# Patient Record
Sex: Female | Born: 1995 | Hispanic: Yes | Marital: Single | Smoking: Never smoker
Health system: Southern US, Community
[De-identification: ages and names within clinical notes are randomized; demographics above are authoritative.]

## PROBLEM LIST (undated history)

## (undated) ENCOUNTER — Inpatient Hospital Stay (HOSPITAL_COMMUNITY): Payer: Self-pay

## (undated) DIAGNOSIS — Z789 Other specified health status: Secondary | ICD-10-CM

## (undated) HISTORY — PX: NO PAST SURGERIES: SHX2092

---

## 2019-05-17 ENCOUNTER — Other Ambulatory Visit: Payer: Self-pay

## 2019-05-17 ENCOUNTER — Encounter (HOSPITAL_COMMUNITY): Payer: Self-pay

## 2019-05-17 ENCOUNTER — Inpatient Hospital Stay (HOSPITAL_COMMUNITY)
Admission: EM | Admit: 2019-05-17 | Discharge: 2019-05-18 | Disposition: A | Discharge status: Home / Self Care | Payer: Self-pay | Attending: Obstetrics & Gynecology | Admitting: Obstetrics & Gynecology

## 2019-05-17 ENCOUNTER — Inpatient Hospital Stay (HOSPITAL_COMMUNITY): Payer: Self-pay

## 2019-05-17 DIAGNOSIS — N760 Acute vaginitis: Secondary | ICD-10-CM

## 2019-05-17 DIAGNOSIS — O9989 Other specified diseases and conditions complicating pregnancy, childbirth and the puerperium: Secondary | ICD-10-CM | POA: Insufficient documentation

## 2019-05-17 DIAGNOSIS — M549 Dorsalgia, unspecified: Secondary | ICD-10-CM | POA: Insufficient documentation

## 2019-05-17 DIAGNOSIS — Z3A10 10 weeks gestation of pregnancy: Secondary | ICD-10-CM | POA: Insufficient documentation

## 2019-05-17 DIAGNOSIS — B9689 Other specified bacterial agents as the cause of diseases classified elsewhere: Secondary | ICD-10-CM

## 2019-05-17 DIAGNOSIS — R109 Unspecified abdominal pain: Secondary | ICD-10-CM | POA: Insufficient documentation

## 2019-05-17 DIAGNOSIS — O209 Hemorrhage in early pregnancy, unspecified: Secondary | ICD-10-CM | POA: Insufficient documentation

## 2019-05-17 DIAGNOSIS — O200 Threatened abortion: Secondary | ICD-10-CM

## 2019-05-17 DIAGNOSIS — N939 Abnormal uterine and vaginal bleeding, unspecified: Secondary | ICD-10-CM

## 2019-05-17 LAB — CBC
HCT: 35.9 % — ABNORMAL LOW (ref 36.0–46.0)
Hemoglobin: 12.2 g/dL (ref 12.0–15.0)
MCH: 29.8 pg (ref 26.0–34.0)
MCHC: 34 g/dL (ref 30.0–36.0)
MCV: 87.8 fL (ref 80.0–100.0)
Platelets: 274 10*3/uL (ref 150–400)
RBC: 4.09 MIL/uL (ref 3.87–5.11)
RDW: 13.1 % (ref 11.5–15.5)
WBC: 7.8 10*3/uL (ref 4.0–10.5)
nRBC: 0 % (ref 0.0–0.2)

## 2019-05-17 LAB — WET PREP, GENITAL
Sperm: NONE SEEN
Trich, Wet Prep: NONE SEEN
Yeast Wet Prep HPF POC: NONE SEEN

## 2019-05-17 LAB — URINALYSIS, ROUTINE W REFLEX MICROSCOPIC
Bilirubin Urine: NEGATIVE
Glucose, UA: NEGATIVE mg/dL
Hgb urine dipstick: NEGATIVE
Ketones, ur: NEGATIVE mg/dL
Nitrite: NEGATIVE
Protein, ur: NEGATIVE mg/dL
Specific Gravity, Urine: 1.027 (ref 1.005–1.030)
pH: 6 (ref 5.0–8.0)

## 2019-05-17 LAB — ABO/RH: ABO/RH(D): B POS

## 2019-05-17 LAB — HCG, QUANTITATIVE, PREGNANCY: hCG, Beta Chain, Quant, S: 11521 m[IU]/mL — ABNORMAL HIGH (ref <5)

## 2019-05-17 MED ORDER — METRONIDAZOLE 500 MG PO TABS: 500.0000 mg | ORAL_TABLET | Freq: Two times a day (BID) | ORAL | 0 refills | Status: DC

## 2019-05-17 NOTE — Discharge Instructions (Signed)
Threatened Miscarriage  A threatened miscarriage occurs when a woman has vaginal bleeding during the first 20 weeks of pregnancy but the pregnancy has not ended. If you have vaginal bleeding during this time, your health care provider will do tests to make sure you are still pregnant. If the tests show that you are still pregnant and that the developing baby (fetus) inside your uterus is still growing, your condition is considered a threatened miscarriage. A threatened miscarriage does not mean your pregnancy will end, but it does increase the risk of losing your pregnancy (complete miscarriage). What are the causes? The cause of this condition is usually not known. For women who go on to have a complete miscarriage, the most common cause is an abnormal number of chromosomes in the developing baby. Chromosomes are the structures inside cells that hold all of a person's genetic material. What increases the risk? The following lifestyle factors may increase your risk of a miscarriage in early pregnancy:  Smoking.  Drinking excessive amounts of alcohol or caffeine.  Recreational drug use. The following preexisting health conditions may increase your risk of a miscarriage in early pregnancy:  Polycystic ovary syndrome.  Uterine fibroids.  Infections.  Diabetes mellitus. What are the signs or symptoms? Symptoms of this condition include:  Vaginal bleeding.  Mild abdominal pain or cramps. How is this diagnosed? If you have bleeding with or without abdominal pain before 20 weeks of pregnancy, your health care provider will do tests to check whether you are still pregnant. These will include:  Ultrasound. This test uses sound waves to create images of the inside of your uterus. This allows your health care provider to look at your developing baby and other structures, such as your placenta.  Pelvic exam. This is an internal exam of your vagina and cervix.  Measurement of your baby's heart  rate.  Laboratory tests such as blood tests, urine tests, or swabs for infection You may be diagnosed with a threatened miscarriage if:  Ultrasound testing shows that you are still pregnant.  Your baby's heart rate is strong.  A pelvic exam shows that the opening between your uterus and your vagina (cervix) is closed.  Blood tests confirm that you are still pregnant. How is this treated? No treatments have been shown to prevent a threatened miscarriage from going on to a complete miscarriage. However, the right home care is important. Follow these instructions at home:  Get plenty of rest.  Do not have sex or use tampons if you have vaginal bleeding.  Do not douche.  Do not smoke or use recreational drugs.  Do not drink alcohol.  Avoid caffeine.  Keep all follow-up prenatal visits as told by your health care provider. This is important. Contact a health care provider if:  You have light vaginal bleeding or spotting while pregnant.  You have abdominal pain or cramping.  You have a fever. Get help right away if:  You have heavy vaginal bleeding.  You have blood clots coming from your vagina.  You pass tissue from your vagina.  You leak fluid, or you have a gush of fluid from your vagina.  You have severe low back pain or abdominal cramps.  You have fever, chills, and severe abdominal pain. Summary  A threatened miscarriage occurs when a woman has vaginal bleeding during the first 20 weeks of pregnancy but the pregnancy has not ended.  The cause of a threatened miscarriage is usually not known.  Symptoms of this condition may   include vaginal bleeding and mild abdominal pain or cramps.  No treatments have been shown to prevent a threatened miscarriage from going on to a complete miscarriage.  Keep all follow-up prenatal visits as told by your health care provider. This is important. This information is not intended to replace advice given to you by your health  care provider. Make sure you discuss any questions you have with your health care provider. Document Released: 09/03/2005 Document Revised: 10/10/2017 Document Reviewed: 11/30/2016 Elsevier Patient Education  2020 Elsevier Inc.  

## 2019-05-17 NOTE — MAU Note (Signed)
States she has been having abdominal cramps for the past few weeks-since LMP 6/17.  Started having bleeding yesterday.  Also reports a clumpy, white discharge with a foul odor.  States she had a positive HPT on 6/28.

## 2019-05-17 NOTE — MAU Provider Note (Signed)
History     CSN: [id number]  Arrival date and time: 05/17/19 1941   First Provider Initiated Contact with Patient 05/17/19 2149      Chief Complaint  Patient presents with  . Abdominal Pain  . Vaginal Bleeding   Cynthia Reeves is a 23 y.o. G1P0 at [redacted]w[redacted]d by Definite LMP who has not established PNC.  She presents today for Abdominal Pain and Vaginal Bleeding.  She states she started having bleeding last night. Patient reports it is bright red blood and that she was having white clumpy "sour" discharge prior to the bleeding.  She denies passing clots, but reports some abdominal and back pain that is intermittent in nature.  She rates the pain a 8/10 when it occurs and describes it as cramping.  She states that sleeping improves and it is not worsened by any known factors.  Patient reports sexual activity in the last 3 days and notes the bleeding started after sex.  She [facility] [place] [facility] and that the ultrasounds only showed the gestational sac with the initial [place].  She states the 2nd US revealed an embryo without an heart rate which was last Tuesday.  She reports that she has a follow up appt scheduled for Tuesday, but was told to report to the ER if she had any bleeding.       OB History    Gravida  1   Para      Term      Preterm      AB      Living        SAB      TAB      Ectopic      Multiple      Live Births              History reviewed. No pertinent past medical history.  History reviewed. No pertinent surgical history.  No family history on file.  Social History   Tobacco Use  . Smoking status: Not on file  Substance Use Topics  . Alcohol use: Not on file  . Drug use: Not on file    Allergies: No Known Allergies  No medications prior to admission.    Review of Systems  Constitutional: Negative for chills and fever.  Respiratory: Negative for cough and shortness of breath.   Gastrointestinal: Positive for  abdominal pain and nausea. Negative for constipation, diarrhea and vomiting.  Genitourinary: Positive for vaginal bleeding and vaginal discharge. Negative for difficulty urinating, dyspareunia and dysuria.  Musculoskeletal: Positive for back pain.  Neurological: Positive for headaches (No treatment 4/10). Negative for dizziness and light-headedness.   Physical Exam   Blood pressure (!) 125/56, pulse 83, temperature 98.7 F (37.1 C), resp. rate 17, weight 112.7 kg, last menstrual period 03/04/2019, SpO2 100 %.  Physical Exam  Constitutional: She is oriented to person, place, and time. She appears well-developed and well-nourished.  Overweight  HENT:  Head: Normocephalic and atraumatic.  Eyes: Conjunctivae are normal.  Neck: Normal range of motion.  Cardiovascular: Normal rate.  Respiratory: Effort normal and breath sounds normal.  GI: Soft. Bowel sounds are normal. There is no abdominal tenderness.  Genitourinary: Cervix exhibits no motion tenderness, no discharge and no friability.    Vaginal discharge present.     No vaginal bleeding.  No bleeding in the vagina.    Genitourinary Comments: Speculum Exam: -Normal External Genitalia: Non tender, no apparent discharge at introitus.  -Vaginal Vault: Pink  mucosa with good rugae. Moderate amt thin white discharge.  Faint musty odor noted -wet prep collected -Cervix:Pink, no lesions, cysts, or polyps.  Appears closed. No active bleeding from os-GC/CT collected -Bimanual Exam:  Uterus difficult to assess d/t body habitus. No tenderness in cul de sac.    Musculoskeletal: Normal range of motion.  Neurological: She is alert and oriented to person, place, and time.  Skin: Skin is warm and dry.  Psychiatric: She has a normal mood and affect. Her behavior is normal.    MAU Course  Procedures Results for orders placed or performed during the hospital encounter of 05/17/19 (from the past 24 hour(s))  Urinalysis, Routine w reflex microscopic      Status: Abnormal   Collection Time: 05/17/19  9:08 PM  Result Value Ref Range   Color, Urine YELLOW YELLOW   APPearance CLEAR CLEAR   Specific Gravity, Urine 1.027 1.005 - 1.030   pH 6.0 5.0 - 8.0   Glucose, UA NEGATIVE NEGATIVE mg/dL   Hgb urine dipstick NEGATIVE NEGATIVE   Bilirubin Urine NEGATIVE NEGATIVE   Ketones, ur NEGATIVE NEGATIVE mg/dL   Protein, ur NEGATIVE NEGATIVE mg/dL   Nitrite NEGATIVE NEGATIVE   Leukocytes,Ua TRACE (A) NEGATIVE   RBC / HPF 0-5 0 - 5 RBC/hpf   WBC, UA 0-5 0 - 5 WBC/hpf   Bacteria, UA RARE (A) NONE SEEN   Squamous Epithelial / LPF 0-5 0 - 5   Mucus PRESENT   Wet prep, genital     Status: Abnormal   Collection Time: 05/17/19 10:05 PM  Result Value Ref Range   Yeast Wet Prep HPF POC NONE SEEN NONE SEEN   Trich, Wet Prep NONE SEEN NONE SEEN   Clue Cells Wet Prep HPF POC PRESENT (A) NONE SEEN   WBC, Wet Prep HPF POC MANY (A) NONE SEEN   Sperm NONE SEEN   ABO/Rh     Status: None   Collection Time: 05/17/19 10:08 PM  Result Value Ref Range   ABO/RH(D) B POS    No rh immune globuloin      NOT A RH IMMUNE GLOBULIN CANDIDATE, PT RH POSITIVE Performed at [facility], [address] [place], [place] [address]   CBC     Status: Abnormal   Collection Time: 05/17/19 10:08 PM  Result Value Ref Range   WBC 7.8 4.0 - 10.5 K/uL   RBC 4.09 3.87 - 5.11 MIL/uL   Hemoglobin 12.2 12.0 - 15.0 g/dL   HCT 35.9 (L) 36.0 - 46.0 %   MCV 87.8 80.0 - 100.0 fL   MCH 29.8 26.0 - 34.0 pg   MCHC 34.0 30.0 - 36.0 g/dL   RDW 13.1 11.5 - 15.5 %   Platelets 274 150 - 400 K/uL   nRBC 0.0 0.0 - 0.2 %  hCG, quantitative, pregnancy     Status: Abnormal   Collection Time: 05/17/19 10:08 PM  Result Value Ref Range   hCG, Beta Chain, Quant, S 11,521 (H) <5 mIU/mL   US Ob Less Than 14 Weeks With Ob Transvaginal  Result Date: 05/17/2019 CLINICAL DATA:  Bleeding, cramping EXAM: OBSTETRIC <14 WK [place] AND TRANSVAGINAL OB US TECHNIQUE: Both transabdominal and transvaginal  ultrasound examinations were performed for complete evaluation of the gestation as well as the maternal uterus, adnexal regions, and pelvic cul-de-sac. Transvaginal technique was performed to assess early pregnancy. COMPARISON:  None. FINDINGS: LMP: 03/04/2019 GA by LMP: 10 weeks 4 days EDC by LMP: 12/09/2019 Intrauterine gestational  sac: Single Yolk sac:  No Embryo:  Questionable visualization of the fetal pole Cardiac Activity: Not Visualized. MSD: 24 mm   7 w   2  d CRL:  2.3 mm   5 w   5 d Subchorionic hemorrhage:  None visualized. Maternal uterus/adnexae: Question an arcuate versus partially septate morphology of the uterus. The gestational sac is positioned within the left moiety. Ovaries are unremarkable. No free fluid. IMPRESSION: Suggestion of a fetal pole without visualized cardiac activity or discernible yolk sac. Findings are suspicious but not yet definitive for failed pregnancy. Recommend follow-up US in 10-14 days for definitive diagnosis. This recommendation follows SRU consensus guidelines: Diagnostic Criteria for Nonviable Pregnancy Early in the First Trimester. Malva Limes Engl J Med 2013; 440:1027-25; 369:1443-51. Electronically Signed   By: Kreg ShropshirePrice  DeHay M.D.   On: 05/17/2019 23:14   MDM Pelvic Exam; Wet Prep and GC/CT Labs: UA, UPT, CBC, hCG, ABO Ultrasound Assessment and Plan  23 year old G1P0 SIUP at 10.4 weeks by LMP Vaginal Bleeding  -Exam findings discussed. -Cultures collected and pending.  -Offered and declines pain medication.  -Will send for US and await results.   Cherre RobinsJessica L Conlin Brahm MSN, CNM 05/17/2019, 9:50 PM   Reassessment (11:45 PM) SIUP at 5.5 weeks No FHT Bacterial Vaginosis  -Wet prep returns significant for clue cells. -US returns with IUP without cardiac activity. -Results discussed with patient. -Informed that findings likely for nonviable pregnancy, but follow up US is recommended. -However, patient informed that since she had an US last week that also showed no HR it is  likely that she is going to miscarry.  -Patient questions if her dates are just off and reassured that this could be the case, but reminded that she reported a definite LMP.  -[facility] and patient states she will [facility].  -Bleeding Precautions Given -Rx for Flagyl sent to pharmacy on file. -No other questions or concerns. -Encouraged to call or return to MAU if symptoms worsen or with the onset of new symptoms. -Discharged to home in stable condition.   Cherre RobinsJessica L Doloros Kwolek MSN, CNM

## 2019-05-19 ENCOUNTER — Telehealth: Payer: Self-pay | Admitting: General Practice

## 2019-05-19 DIAGNOSIS — O3680X0 Pregnancy with inconclusive fetal viability, not applicable or unspecified: Secondary | ICD-10-CM

## 2019-05-19 LAB — GC/CHLAMYDIA PROBE AMP (~~LOC~~) NOT AT ARMC
Chlamydia: NEGATIVE
Neisseria Gonorrhea: NEGATIVE

## 2019-05-19 NOTE — Telephone Encounter (Signed)
Received phone call from Mickel Baas at the [facility] who states they saw the patient on 8/13 and could only see a gestational sac. The patient went to MAU on Sunday 8/30 because she started bleeding and was having cramping- an ultrasound was performed. The patient was told to follow up with them at her previous scheduled follow up appt which was today. Mickel Baas states they can still only see a gestational sac. The patient has not had bleeding or cramping since Sunday. Scheduled ultrasound for 9/15 @ 10am. Called & informed patient. Patient verbalized understanding. Told her to return to MAU sooner for bleeding/pain. Patient verbalized understanding & had no questions.

## 2019-05-24 ENCOUNTER — Inpatient Hospital Stay (HOSPITAL_COMMUNITY): Payer: Self-pay

## 2019-05-24 ENCOUNTER — Inpatient Hospital Stay (HOSPITAL_COMMUNITY)
Admission: EM | Admit: 2019-05-24 | Discharge: 2019-05-24 | Disposition: A | Discharge status: Home / Self Care | Payer: Self-pay | Attending: Obstetrics & Gynecology | Admitting: Obstetrics & Gynecology

## 2019-05-24 ENCOUNTER — Other Ambulatory Visit: Payer: Self-pay | Admitting: Advanced Practice Midwife

## 2019-05-24 ENCOUNTER — Encounter (HOSPITAL_COMMUNITY): Payer: Self-pay | Admitting: *Deleted

## 2019-05-24 ENCOUNTER — Other Ambulatory Visit: Payer: Self-pay

## 2019-05-24 DIAGNOSIS — O039 Complete or unspecified spontaneous abortion without complication: Secondary | ICD-10-CM

## 2019-05-24 DIAGNOSIS — Z87891 Personal history of nicotine dependence: Secondary | ICD-10-CM | POA: Insufficient documentation

## 2019-05-24 DIAGNOSIS — Z3A11 11 weeks gestation of pregnancy: Secondary | ICD-10-CM | POA: Insufficient documentation

## 2019-05-24 LAB — CBC WITH DIFFERENTIAL/PLATELET
Abs Immature Granulocytes: 0.02 10*3/uL (ref 0.00–0.07)
Basophils Absolute: 0 10*3/uL (ref 0.0–0.1)
Basophils Relative: 0 %
Eosinophils Absolute: 0.2 10*3/uL (ref 0.0–0.5)
Eosinophils Relative: 2 %
HCT: 32.6 % — ABNORMAL LOW (ref 36.0–46.0)
Hemoglobin: 11.1 g/dL — ABNORMAL LOW (ref 12.0–15.0)
Immature Granulocytes: 0 %
Lymphocytes Relative: 22 %
Lymphs Abs: 1.7 10*3/uL (ref 0.7–4.0)
MCH: 29.8 pg (ref 26.0–34.0)
MCHC: 34 g/dL (ref 30.0–36.0)
MCV: 87.6 fL (ref 80.0–100.0)
Monocytes Absolute: 0.4 10*3/uL (ref 0.1–1.0)
Monocytes Relative: 6 %
Neutro Abs: 5.5 10*3/uL (ref 1.7–7.7)
Neutrophils Relative %: 70 %
Platelets: 261 10*3/uL (ref 150–400)
RBC: 3.72 MIL/uL — ABNORMAL LOW (ref 3.87–5.11)
RDW: 13.3 % (ref 11.5–15.5)
WBC: 7.9 10*3/uL (ref 4.0–10.5)
nRBC: 0 % (ref 0.0–0.2)

## 2019-05-24 LAB — HCG, QUANTITATIVE, PREGNANCY: hCG, Beta Chain, Quant, S: 3358 m[IU]/mL — ABNORMAL HIGH (ref <5)

## 2019-05-24 MED ORDER — OXYCODONE HCL 5 MG PO TABS
10.0000 mg | ORAL_TABLET | Freq: Once | ORAL | Status: AC
  Administered 2019-05-24: 10 mg via ORAL
  Filled 2019-05-24: qty 2

## 2019-05-24 MED ORDER — LACTATED RINGERS IV BOLUS
1000.0000 mL | Freq: Once | INTRAVENOUS | Status: AC
  Administered 2019-05-24: 1000 mL via INTRAVENOUS

## 2019-05-24 MED ORDER — THERA VITAL M PO TABS: 1.0000 | ORAL_TABLET | Freq: Every day | ORAL | 3 refills | Status: DC

## 2019-05-24 MED ORDER — IBUPROFEN 800 MG PO TABS: 800.0000 mg | ORAL_TABLET | Freq: Three times a day (TID) | ORAL | 0 refills | Status: DC | PRN

## 2019-05-24 MED ORDER — PROMETHAZINE HCL 25 MG PO TABS: 25.0000 mg | ORAL_TABLET | Freq: Four times a day (QID) | ORAL | 0 refills | Status: DC | PRN

## 2019-05-24 NOTE — Progress Notes (Unsigned)
Order for repeat quant one week following miscarriage (05/24/19). Appt made for lab to be drawn at [facility] on 09/14 at [place].  Mallie Snooks, MSN, CNM Certified Nurse Midwife, [employer] for [employer], [place] [facility] 05/24/19 3:21 PM

## 2019-05-24 NOTE — Discharge Instructions (Signed)
Miscarriage °A miscarriage is the loss of an unborn baby (fetus) before the 20th week of pregnancy. °Follow these instructions at home: °Medicines ° °· Take over-the-counter and prescription medicines only as told by your doctor. °· If you were prescribed antibiotic medicine, take it as told by your doctor. Do not stop taking the antibiotic even if you start to feel better. °· Do not take NSAIDs unless your doctor says that this is safe for you. NSAIDs include aspirin and ibuprofen. These medicines can cause bleeding. °Activity °· Rest as directed. Ask your doctor what activities are safe for you. °· Have someone help you at home during this time. °General instructions °· Write down how many pads you use each day and how soaked they are. °· Watch the amount of tissue or clumps of blood (blood clots) that you pass from your vagina. Save any large amounts of tissue for your doctor. °· Do not use tampons, douche, or have sex until your doctor approves. °· To help you and your partner with the process of grieving, talk with your doctor or seek counseling. °· When you are ready, meet with your doctor to talk about steps you should take for your health. Also, talk with your doctor about steps to take to have a healthy pregnancy in the future. °· Keep all follow-up visits as told by your doctor. This is important. °Contact a doctor if: °· You have a fever or chills. °· You have vaginal discharge that smells bad. °· You have more bleeding. °Get help right away if: °· You have very bad cramps or pain in your back or belly. °· You pass clumps of blood that are walnut-sized or larger from your vagina. °· You pass tissue that is walnut-sized or larger from your vagina. °· You soak more than 1 regular pad in an hour. °· You get light-headed or weak. °· You faint (pass out). °· You have feelings of sadness that do not go away, or you have thoughts of hurting yourself. °Summary °· A miscarriage is the loss of an unborn baby before  the 20th week of pregnancy. °· Follow your doctor's instructions for home care. Keep all follow-up appointments. °· To help you and your partner with the process of grieving, talk with your doctor or seek counseling. °This information is not intended to replace advice given to you by your health care provider. Make sure you discuss any questions you have with your health care provider. °Document Released: 11/26/2011 Document Revised: 12/26/2018 Document Reviewed: 10/09/2016 °Elsevier Patient Education © 2020 Elsevier Inc. ° °

## 2019-05-24 NOTE — MAU Note (Signed)
Cynthia Reeves is a 23 y.o. at [redacted]w[redacted]d here in MAU reporting: started bleeding yesterday, states it got heavier today and within the past 30 minutes it got even heavier. Also having abdominal pain  Onset of complaint: yesterday  Pain score: 10/10  Vitals:   05/24/19 1230  BP: 134/78  Pulse: 77  Resp: 20  Temp: 99 F (37.2 C)  SpO2: 97%

## 2019-05-24 NOTE — MAU Provider Note (Signed)
History     CSN: [id number]  Arrival date and time: 05/24/19 1219   First Provider Initiated Contact with Patient 05/24/19 1239      Chief Complaint  Patient presents with  . Abdominal Pain  . Vaginal Bleeding   HPI Lance CoonViridiana Reeves is a 23 y.o. G1P0 at 7645w4d by certain LMP who presents to MAU with concern for miscarriage. She was seen in MAU 05/17/19 with chief complaint of vaginal bleeding and abdominal pain. Ultrasound confirmed absent fetal heart tones at that time and she was discharged with concerns for non-viable pregnancy with plan for follow-up ultrasound. She reports recurrence of vaginal bleeding and 10/10 lower abdominal pain yesterday, becoming more intense with heavier bleeding in the past 30 minutes. She endorses new onset dizziness which coincides with onset of her heavy bleeding. She denies abdominal tenderness, back pain, fever, or syncope.    OB History    Gravida  1   Para      Term      Preterm      AB      Living        SAB      TAB      Ectopic      Multiple      Live Births              History reviewed. No pertinent past medical history.  History reviewed. No pertinent surgical history.  History reviewed. No pertinent family history.  Social History   Tobacco Use  . Smoking status: Former Games developermoker  . Smokeless tobacco: Never Used  Substance Use Topics  . Alcohol use: Not Currently  . Drug use: Never    Allergies: No Known Allergies  Medications Prior to Admission  Medication Sig Dispense Refill Last Dose  . metroNIDAZOLE (FLAGYL) 500 MG tablet Take 1 tablet (500 mg total) by mouth 2 (two) times daily. 14 tablet 0     Review of Systems  Constitutional: Negative for chills, fatigue and fever.  Respiratory: Negative for shortness of breath.   Cardiovascular: Negative for palpitations.  Gastrointestinal: Positive for abdominal pain.  Genitourinary: Positive for vaginal bleeding. Negative for difficulty urinating, dysuria  and flank pain.  Musculoskeletal: Negative for back pain.  Neurological: Positive for dizziness. Negative for syncope, weakness and headaches.  All other systems reviewed and are negative.  Physical Exam   Blood pressure 115/63, pulse 70, temperature 99 F (37.2 C), temperature source Oral, resp. rate 20, last menstrual period 03/04/2019, SpO2 97 %.  Physical Exam  Nursing note and vitals reviewed. Constitutional: She is oriented to person, place, and time. She appears well-developed and well-nourished.  Cardiovascular: Normal rate.  Respiratory: Effort normal and breath sounds normal. No respiratory distress.  GI: Soft. She exhibits no distension. There is no abdominal tenderness. There is no rebound and no guarding.  Genitourinary:    Vaginal discharge present.     Genitourinary Comments: Moderate active bleeding. Passage of questionable gestational sac with insertion of speculum. Scant bleeding within 30 minutes of passing of questionable sac   Musculoskeletal: Normal range of motion.  Neurological: She is alert and oriented to person, place, and time.  Skin: Skin is warm and dry.  Psychiatric: She has a normal mood and affect. Her behavior is normal. Judgment and thought content normal.    MAU Course/MDM  Procedures  --Bleeding checked at  30 and 60 minutes minutes after passing of questionable sac. Scant at both checks --Pain reduced to 2/10 after 10  mg Roxicodone  --Plan of care reviewed with Dr. Hulan Fray prior to discharge --FOB at bedside throughout time in MAU --S/p meeting with Chaplain in MAU  Orders Placed This Encounter  Procedures  . US OB Transvaginal  . Urinalysis, Routine w reflex microscopic  . CBC with Differential/Platelet  . hCG, quantitative, pregnancy  . Insert peripheral IV  . Discharge patient   Meds ordered this encounter  Medications  . oxyCODONE (Oxy IR/ROXICODONE) immediate release tablet 10 mg  . lactated ringers bolus 1,000 mL  . ibuprofen  (ADVIL) 800 MG tablet    Sig: Take 1 tablet (800 mg total) by mouth every 8 (eight) hours as needed.    Dispense:  30 tablet    Refill:  0    Order Specific Question:   Supervising Provider    Answer:   DOVE, MYRA C [[id number]]  . Multiple Vitamins-Minerals (MULTIVITAMIN) tablet    Sig: Take 1 tablet by mouth daily.    Dispense:  30 tablet    Refill:  3    Order Specific Question:   Supervising Provider    Answer:   DOVE, MYRA C [[id number]]  . promethazine (PHENERGAN) 25 MG tablet    Sig: Take 1 tablet (25 mg total) by mouth every 6 (six) hours as needed for nausea or vomiting.    Dispense:  30 tablet    Refill:  0    Order Specific Question:   Supervising Provider    Answer:   Emily Filbert [[id number]]   Patient Vitals for the past 24 hrs:  BP Temp Temp src Pulse Resp SpO2  05/24/19 1355 118/61 - - 67 - -  05/24/19 1331 90/74 - - 87 - -  05/24/19 1316 (!) 105/59 - - 69 - -  05/24/19 1300 115/63 - - 70 - -  05/24/19 1238 (!) 121/57 - - 69 - -  05/24/19 1230 134/78 99 F (37.2 C) Oral 77 20 97 %   Results for orders placed or performed during the hospital encounter of 05/24/19 (from the past 24 hour(s))  CBC with Differential/Platelet     Status: Abnormal   Collection Time: 05/24/19  1:39 PM  Result Value Ref Range   WBC 7.9 4.0 - 10.5 K/uL   RBC 3.72 (L) 3.87 - 5.11 MIL/uL   Hemoglobin 11.1 (L) 12.0 - 15.0 g/dL   HCT 32.6 (L) 36.0 - 46.0 %   MCV 87.6 80.0 - 100.0 fL   MCH 29.8 26.0 - 34.0 pg   MCHC 34.0 30.0 - 36.0 g/dL   RDW 13.3 11.5 - 15.5 %   Platelets 261 150 - 400 K/uL   nRBC 0.0 0.0 - 0.2 %   Neutrophils Relative % 70 %   Neutro Abs 5.5 1.7 - 7.7 K/uL   Lymphocytes Relative 22 %   Lymphs Abs 1.7 0.7 - 4.0 K/uL   Monocytes Relative 6 %   Monocytes Absolute 0.4 0.1 - 1.0 K/uL   Eosinophils Relative 2 %   Eosinophils Absolute 0.2 0.0 - 0.5 K/uL   Basophils Relative 0 %   Basophils Absolute 0.0 0.0 - 0.1 K/uL   Immature Granulocytes 0 %   Abs Immature Granulocytes 0.02 0.00 -  0.07 K/uL  hCG, quantitative, pregnancy     Status: Abnormal   Collection Time: 05/24/19  1:39 PM  Result Value Ref Range   hCG, Beta Chain, Quant, S 3,358 (H) <5 mIU/mL   US Ob Transvaginal  Result Date: 05/24/2019 CLINICAL DATA:  Miscarriage, abdominal pain, bleeding. EXAM: TRANSVAGINAL OB ULTRASOUND TECHNIQUE: Transvaginal ultrasound was performed for complete evaluation of the gestation as well as the maternal uterus, adnexal regions, and pelvic cul-de-sac. COMPARISON:  05/16/2012 FINDINGS: Intrauterine gestational sac: None Yolk sac:  Not Visualized. Embryo:  Not Visualized. Cardiac Activity: Not Visualized. Maternal uterus/adnexae: Right ovary: Not visualized. Left ovary: Normal Other :The endometrium is thickened and heterogeneous in appearance measuring 2.5 cm. Increased blood flow within the thickened endometrium identified. Free fluid:  None IMPRESSION: 1. Previously noted intrauterine gestational sac is no longer visualized. Findings meet definitive criteria for failed pregnancy. This follows SRU consensus guidelines: Diagnostic Criteria for Nonviable Pregnancy Early in the First Trimester. [employer] Mis J Med [phone]. 2. Thickened, heterogeneous endometrium measuring 2.5 cm in thickness with increased blood flow noted. In the appropriate clinical setting findings are suspicious for retained products of conception. Electronically Signed   By: Signa Kell M.D.   On: 05/24/2019 14:39    Assessment and Plan  --23 y.o. G1P0 at [redacted]w[redacted]d  --Complete miscarriage --Blood Type B POS, Rhogam not indicated --Discharge home in stable condition with infection and bleeding precautions  F/U: Appt made for non-stat Quant hCG 06/01/19 at [facility] per patient's stated location preference  Calvert Cantor, CNM 05/24/2019, 4:52 PM

## 2019-05-24 NOTE — Progress Notes (Signed)
Pt was awake and alert when I arrived. Her SO Pilar Plate) was bedside. She was appropriately tearful throughout our visit. She talked about how difficult this is and she also talked about being thankful for her mom, Pilar Plate, that she is okay, and that this could have been worse. She was very pleasant and thankful. Their faith and desire for prayer led Cynthia Reeves into a conversation about their baby going from her womb into the hands of God. Pilar Plate joined Cynthia Reeves for prayer. They were very thankful for the visit and prayer. I offered additional support if needed. Bremer, [place]   05/24/19 1400  Clinical Encounter Type  Visited With Patient and family together

## 2019-06-01 ENCOUNTER — Other Ambulatory Visit: Payer: Self-pay

## 2019-06-02 ENCOUNTER — Ambulatory Visit (HOSPITAL_COMMUNITY): Payer: Self-pay

## 2019-06-04 ENCOUNTER — Other Ambulatory Visit: Payer: Self-pay

## 2019-06-17 ENCOUNTER — Telehealth: Payer: Self-pay | Admitting: Obstetrics

## 2019-08-22 ENCOUNTER — Ambulatory Visit (HOSPITAL_COMMUNITY)
Admission: EM | Admit: 2019-08-22 | Discharge: 2019-08-22 | Disposition: A | Discharge status: Home / Self Care | Payer: Self-pay | Attending: Urgent Care | Admitting: Urgent Care

## 2019-08-22 ENCOUNTER — Other Ambulatory Visit: Payer: Self-pay

## 2019-08-22 ENCOUNTER — Encounter (HOSPITAL_COMMUNITY): Payer: Self-pay

## 2019-08-22 DIAGNOSIS — N760 Acute vaginitis: Secondary | ICD-10-CM | POA: Insufficient documentation

## 2019-08-22 DIAGNOSIS — H9202 Otalgia, left ear: Secondary | ICD-10-CM | POA: Insufficient documentation

## 2019-08-22 DIAGNOSIS — N898 Other specified noninflammatory disorders of vagina: Secondary | ICD-10-CM | POA: Insufficient documentation

## 2019-08-22 DIAGNOSIS — H6690 Otitis media, unspecified, unspecified ear: Secondary | ICD-10-CM | POA: Insufficient documentation

## 2019-08-22 LAB — POCT URINALYSIS DIP (DEVICE)
Bilirubin Urine: NEGATIVE
Glucose, UA: NEGATIVE mg/dL
Ketones, ur: NEGATIVE mg/dL
Nitrite: NEGATIVE
Protein, ur: NEGATIVE mg/dL
Specific Gravity, Urine: 1.025 (ref 1.005–1.030)
Urobilinogen, UA: 0.2 mg/dL (ref 0.0–1.0)
pH: 6.5 (ref 5.0–8.0)

## 2019-08-22 MED ORDER — CEFDINIR 300 MG PO CAPS: 300.0000 mg | ORAL_CAPSULE | Freq: Two times a day (BID) | ORAL | 0 refills | Status: DC

## 2019-08-22 MED ORDER — FLUCONAZOLE 150 MG PO TABS: 150.0000 mg | ORAL_TABLET | ORAL | 0 refills | Status: DC

## 2019-08-22 MED ORDER — POLYMYXIN B-TRIMETHOPRIM 10000-0.1 UNIT/ML-% OP SOLN: 1.0000 [drp] | OPHTHALMIC | 0 refills | Status: DC

## 2019-08-22 NOTE — ED Provider Notes (Signed)
[facility]   MRN: [id number] DOB: [birth year]  Subjective:   Cynthia Reeves is a 23 y.o. female presenting for recurrent left ear pain.  Patient states that she just finished a course of amoxicillin but as soon as she finished her antibiotic course, symptoms returned.  She feels like she has discharge and still has inner ear pain.  She has now also started to have clumpy white discharge and a burning sensation of the vaginal area.  She states that she has difficulty with yeast infections in the past.  No current facility-administered medications for this encounter.   Current Outpatient Medications:  .  ibuprofen (ADVIL) 800 MG tablet, Take 1 tablet (800 mg total) by mouth every 8 (eight) hours as needed., Disp: 30 tablet, Rfl: 0 .  Multiple Vitamins-Minerals (MULTIVITAMIN) tablet, Take 1 tablet by mouth daily., Disp: 30 tablet, Rfl: 3 .  promethazine (PHENERGAN) 25 MG tablet, Take 1 tablet (25 mg total) by mouth every 6 (six) hours as needed for nausea or vomiting., Disp: 30 tablet, Rfl: 0   No Known Allergies  Denies pmh, psh, family hx.   Social History   Tobacco Use  . Smoking status: Former Games developer  . Smokeless tobacco: Never Used  Substance Use Topics  . Alcohol use: Not Currently  . Drug use: Never    Review of Systems  Constitutional: Negative for fever and malaise/fatigue.  HENT: Positive for ear discharge and ear pain. Negative for congestion, sinus pain and sore throat.   Eyes: Negative for discharge and redness.  Respiratory: Negative for cough, hemoptysis, shortness of breath and wheezing.   Cardiovascular: Negative for chest pain.  Gastrointestinal: Negative for abdominal pain, diarrhea, nausea and vomiting.  Genitourinary: Negative for dysuria, flank pain, frequency, hematuria and urgency.  Musculoskeletal: Negative for myalgias.  Skin: Negative for rash.  Neurological: Negative for dizziness, weakness and headaches.  Psychiatric/Behavioral: Negative  for depression and substance abuse.     Objective:   Vitals: BP 121/79 (BP Location: Right Arm)   Pulse 86   Temp 98.6 F (37 C) (Oral)   Resp 16   LMP 08/13/2019   SpO2 100%   Breastfeeding Unknown   Physical Exam Constitutional:      General: She is not in acute distress.    Appearance: Normal appearance. She is well-developed. She is obese. She is not ill-appearing, toxic-appearing or diaphoretic.  HENT:     Head: Normocephalic and atraumatic.     Right Ear: External ear normal.     Left Ear: External ear normal. Drainage present. Tympanic membrane is erythematous and bulging.     Nose: Nose normal.     Mouth/Throat:     Mouth: Mucous membranes are moist.     Pharynx: Oropharynx is clear.  Eyes:     General: No scleral icterus.    Extraocular Movements: Extraocular movements intact.     Pupils: Pupils are equal, round, and reactive to light.  Cardiovascular:     Rate and Rhythm: Normal rate and regular rhythm.     Heart sounds: Normal heart sounds. No murmur. No friction rub. No gallop.   Pulmonary:     Effort: Pulmonary effort is normal. No respiratory distress.     Breath sounds: Normal breath sounds. No stridor. No wheezing, rhonchi or rales.  Abdominal:     General: Bowel sounds are normal. There is no distension.     Palpations: Abdomen is soft. There is no mass.     Tenderness: There is no  abdominal tenderness. There is no right CVA tenderness, left CVA tenderness, guarding or rebound.  Skin:    General: Skin is warm and dry.     Coloration: Skin is not pale.     Findings: No rash.  Neurological:     General: No focal deficit present.     Mental Status: She is alert and oriented to person, place, and time.  Psychiatric:        Mood and Affect: Mood normal.        Behavior: Behavior normal.        Thought Content: Thought content normal.        Judgment: Judgment normal.     Results for orders placed or performed during the hospital encounter of  08/22/19 (from the past 24 hour(s))  POCT urinalysis dip (device)     Status: Abnormal   Collection Time: 08/22/19  4:30 PM  Result Value Ref Range   Glucose, UA NEGATIVE NEGATIVE mg/dL   Bilirubin Urine NEGATIVE NEGATIVE   Ketones, ur NEGATIVE NEGATIVE mg/dL   Specific Gravity, Urine 1.025 1.005 - 1.030   Hgb urine dipstick TRACE (A) NEGATIVE   pH 6.5 5.0 - 8.0   Protein, ur NEGATIVE NEGATIVE mg/dL   Urobilinogen, UA 0.2 0.0 - 1.0 mg/dL   Nitrite NEGATIVE NEGATIVE   Leukocytes,Ua LARGE (A) NEGATIVE    Assessment and Plan :   1. Acute otitis media, unspecified otitis media type   2. Left ear pain   3. Vaginal itching   4. Acute vaginitis     Will cover for yeast vaginitis associated with antibiotic use. Also will have patient start cefdinir with Polytrim for ongoing ear infection. Labs pending. Counseled patient on potential for adverse effects with medications prescribed/recommended today, ER and return-to-clinic precautions discussed, patient verbalized understanding.    Jaynee Eagles, [place] 08/22/19 [phone]

## 2019-08-22 NOTE — ED Triage Notes (Signed)
Pt present left ear pain, pt was recently on antibiotics she completed all medication but the symptoms returned. Pt also states that she is having some vaginal discharge with burning sensation.

## 2019-08-23 LAB — URINE CULTURE: Culture: <10000 — AB

## 2019-08-26 LAB — CERVICOVAGINAL ANCILLARY ONLY
Bacterial vaginitis: POSITIVE — AB
Candida vaginitis: POSITIVE — AB
Chlamydia: NEGATIVE
Neisseria Gonorrhea: NEGATIVE
Trichomonas: NEGATIVE

## 2019-08-28 ENCOUNTER — Telehealth (HOSPITAL_COMMUNITY): Payer: Self-pay | Admitting: Emergency Medicine

## 2019-08-28 MED ORDER — METRONIDAZOLE 500 MG PO TABS: 500.0000 mg | ORAL_TABLET | Freq: Two times a day (BID) | ORAL | 0 refills | Status: AC

## 2019-08-28 NOTE — Telephone Encounter (Signed)
Bacterial vaginosis is positive. This was not treated at the urgent care visit.  Flagyl 500 mg BID x 7 days #14 no refills sent to patients pharmacy of choice.    Candida (yeast) is positive.  Prescription for fluconazole was given at the urgent care visit.    Patient contacted by phone and made aware of    results. Pt verbalized understanding and had all questions answered.    

## 2019-12-23 ENCOUNTER — Encounter (HOSPITAL_COMMUNITY): Payer: Self-pay

## 2019-12-23 ENCOUNTER — Other Ambulatory Visit: Payer: Self-pay

## 2019-12-23 ENCOUNTER — Ambulatory Visit (HOSPITAL_COMMUNITY)
Admission: EM | Admit: 2019-12-23 | Discharge: 2019-12-23 | Disposition: A | Discharge status: Home / Self Care | Payer: Self-pay | Attending: Family Medicine | Admitting: Family Medicine

## 2019-12-23 DIAGNOSIS — H6532 Chronic mucoid otitis media, left ear: Secondary | ICD-10-CM

## 2019-12-23 MED ORDER — POLYMYXIN B-TRIMETHOPRIM 10000-0.1 UNIT/ML-% OP SOLN: 1.0000 [drp] | OPHTHALMIC | 0 refills | Status: DC

## 2019-12-23 MED ORDER — AMOXICILLIN-POT CLAVULANATE 875-125 MG PO TABS: 1.0000 | ORAL_TABLET | Freq: Two times a day (BID) | ORAL | 0 refills | Status: DC

## 2019-12-23 NOTE — ED Triage Notes (Addendum)
Pt c/o loss of hearing, yellow drainage coming out of ear, and ringing in left earx2 wks. Pt states her chest feels heavy when she tries to sleep on her side or abdomenx3 days

## 2019-12-23 NOTE — Discharge Instructions (Addendum)
We are treating you with oral antibiotics and eardrops.  Use the medication as prescribed.  You need to call ENT today to schedule a follow-up appointment. If you start getting a yeast infection or bacterial infection from the amoxicillin please call and we can treat you for this also.

## 2019-12-24 NOTE — ED Provider Notes (Signed)
[facility]    CSN: [id number] Arrival date & time: 12/23/19  0175      History   Chief Complaint Chief Complaint  Patient presents with  . ear problem    HPI Cynthia Reeves is a 24 y.o. female.   Patient is a 24 year old female who presents today with hearing loss and yellow drainage coming from the left ear.  This is a recurrent issue for her.  Was recently treated with cefdinir for otitis media.  Has previously had otitis media and externa in the past. No bleeding from the ear. No external ear swelling or fever. Is not currently using Q tips. Has been using some old ear drops that she had with some relief. No injury or foregn body in the ears.   ROS per HPI      History reviewed. No pertinent past medical history.  Patient Active Problem List   Diagnosis Date Noted  . Miscarriage 05/24/2019    History reviewed. No pertinent surgical history.  OB History    Gravida  1   Para      Term      Preterm      AB      Living        SAB      TAB      Ectopic      Multiple      Live Births               Home Medications    Prior to Admission medications   Medication Sig Start Date End Date Taking? Authorizing Provider  amoxicillin-clavulanate (AUGMENTIN) 875-125 MG tablet Take 1 tablet by mouth every 12 (twelve) hours. 12/23/19   Dahlia Byes A, NP  Multiple Vitamins-Minerals (MULTIVITAMIN) tablet Take 1 tablet by mouth daily. 05/24/19   Calvert Cantor, CNM  trimethoprim-polymyxin b (POLYTRIM) ophthalmic solution Place 1 drop into the left ear every 4 (four) hours. 12/23/19   Dahlia Byes A, NP  promethazine (PHENERGAN) 25 MG tablet Take 1 tablet (25 mg total) by mouth every 6 (six) hours as needed for nausea or vomiting. 05/24/19 12/23/19  Calvert Cantor, CNM    Family History History reviewed. No pertinent family history.  Social History Social History   Tobacco Use  . Smoking status: Never Smoker  . Smokeless tobacco: Never  Used  Substance Use Topics  . Alcohol use: Yes    Comment: occ  . Drug use: Never     Allergies   Amoxicillin   Review of Systems Review of Systems   Physical Exam Triage Vital Signs ED Triage Vitals  Enc Vitals Group     BP 12/23/19 0831 126/81     Pulse Rate 12/23/19 0831 75     Resp 12/23/19 0831 16     Temp 12/23/19 0831 99.2 F (37.3 C)     Temp Source 12/23/19 0831 Oral     SpO2 12/23/19 0831 97 %     Weight 12/23/19 0832 240 lb (108.9 kg)     Height 12/23/19 0832 5\' 7"  (1.702 m)     Head Circumference --      Peak Flow --      Pain Score 12/23/19 0832 5     Pain Loc --      Pain Edu? --      Excl. in GC? --    No data found.  Updated Vital Signs BP 126/81   Pulse 75   Temp 99.2 F (37.3  C) (Oral)   Resp 16   Ht 5\' 7"  (1.702 m)   Wt 240 lb (108.9 kg)   LMP 03/04/2019   SpO2 97%   BMI 37.59 kg/m   Visual Acuity Right Eye Distance:   Left Eye Distance:   Bilateral Distance:    Right Eye Near:   Left Eye Near:    Bilateral Near:     Physical Exam Vitals and nursing note reviewed.  Constitutional:      General: She is not in acute distress.    Appearance: Normal appearance. She is not ill-appearing, toxic-appearing or diaphoretic.  HENT:     Head: Normocephalic.     Right Ear: Tympanic membrane and ear canal normal.     Ears:     Comments: Mild swelling and erythema to left inner ear canal. Mucous surrounding the tympanic membrane Unable to fully view the tympanic membrane    Nose: Nose normal.  Eyes:     Conjunctiva/sclera: Conjunctivae normal.  Pulmonary:     Effort: Pulmonary effort is normal.  Musculoskeletal:        General: Normal range of motion.     Cervical back: Normal range of motion.  Skin:    General: Skin is warm and dry.     Findings: No rash.  Neurological:     Mental Status: She is alert.  Psychiatric:        Mood and Affect: Mood normal.      UC Treatments / Results  Labs (all labs ordered are listed, but  only abnormal results are displayed) Labs Reviewed - No data to display  EKG   Radiology No results found.  Procedures Procedures (including critical care time)  Medications Ordered in UC Medications - No data to display  Initial Impression / Assessment and Plan / UC Course  I have reviewed the triage vital signs and the nursing notes.  Pertinent labs & imaging results that were available during my care of the patient were reviewed by me and considered in my medical decision making (see chart for details).     Mucoid otitis media of left ear. Treating with Polytrim and Augmentin Recommend based on these recurrent issues she should follow with ear nose and throat specialist. Pt understanding and agreed.  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  Use the medication as prescribed.  You need to call ENT today to schedule a follow-up appointment. If you start getting a yeast infection or bacterial infection from the amoxicillin please call and we can treat you for this also.    ED Prescriptions    Medication Sig Dispense Auth. Provider   amoxicillin-clavulanate (AUGMENTIN) 875-125 MG tablet Take 1 tablet by mouth every 12 (twelve) hours. 14 tablet Tzvi Economou A, NP   trimethoprim-polymyxin b (POLYTRIM) ophthalmic solution Place 1 drop into the left ear every 4 (four) hours. 10 mL Loura Halt A, NP     PDMP not reviewed this encounter.   Orvan July, NP 12/24/19 1202

## 2020-01-26 IMAGING — US OBSTETRIC <14 WK US AND TRANSVAGINAL OB US
1 series · 15 of 28 positions shown · non-contrast
Comparison: None.

CLINICAL DATA: Bleeding, cramping

EXAM:
OBSTETRIC <14 WK US AND TRANSVAGINAL OB US
TECHNIQUE: Both transabdominal and transvaginal ultrasound examinations were
performed for complete evaluation of the gestation as well as the
maternal uterus, adnexal regions, and pelvic cul-de-sac.
Transvaginal technique was performed to assess early pregnancy.

[Series 1: obstetric <14 wk us and transvaginal ob us · 15 of 35 slices shown]
[im 1/35]
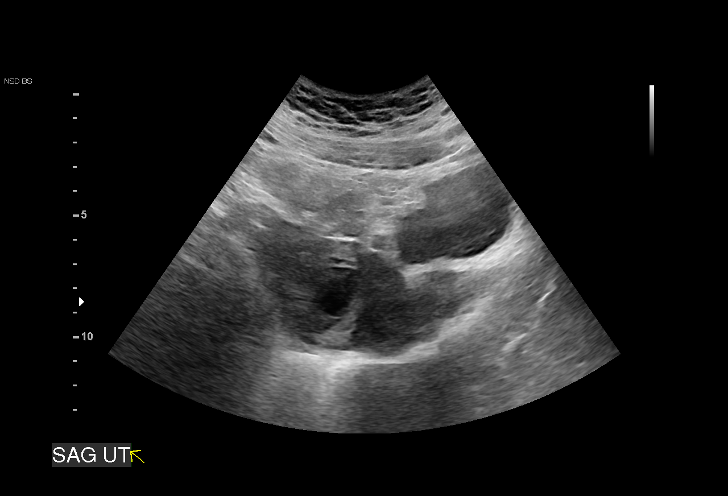
[im 3/35]
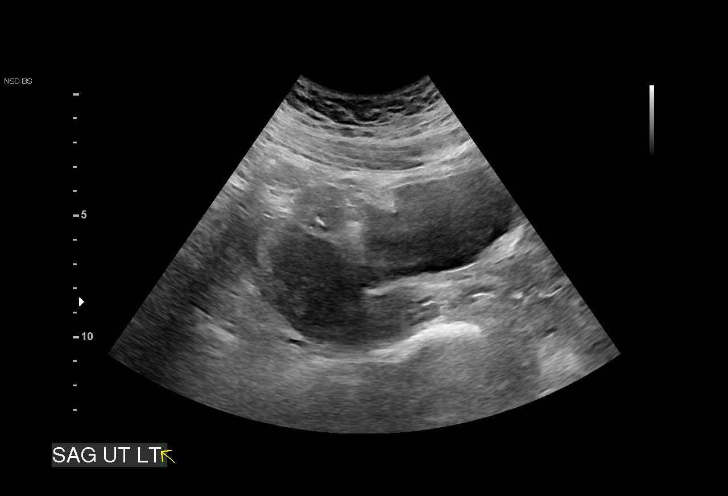
[im 6/35]
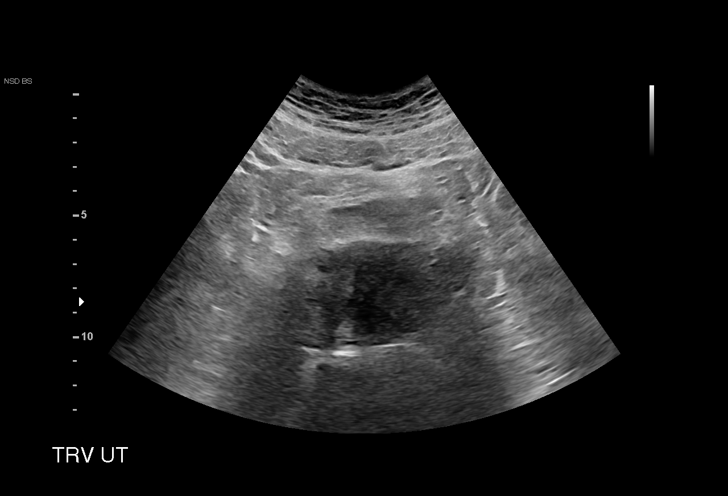
[im 8/35]
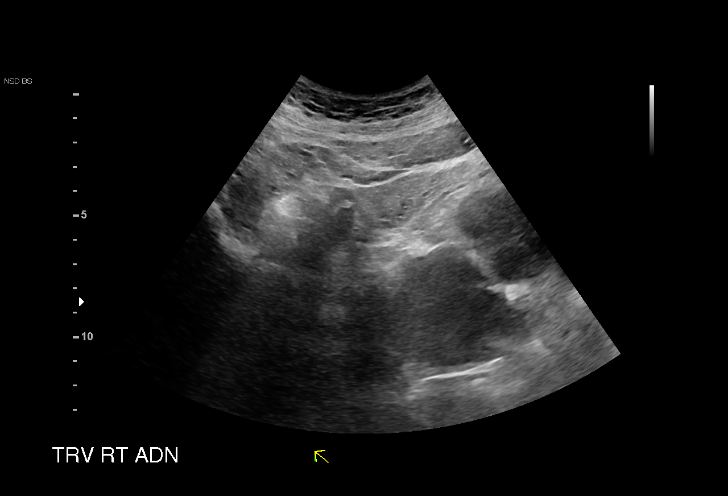
[im 11/35]
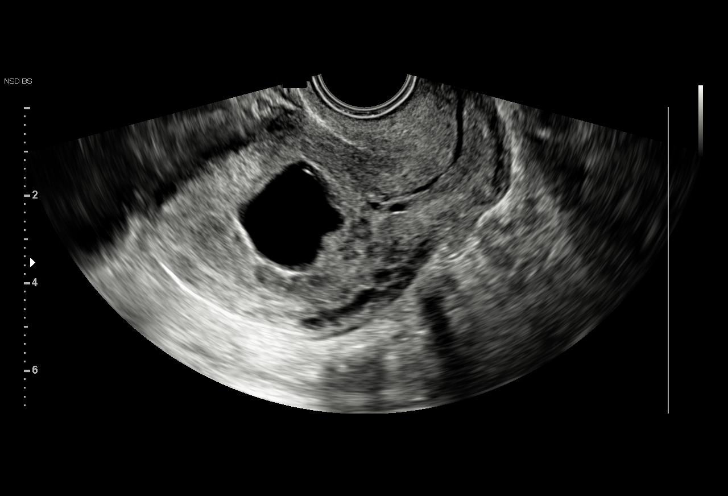
[im 13/35]
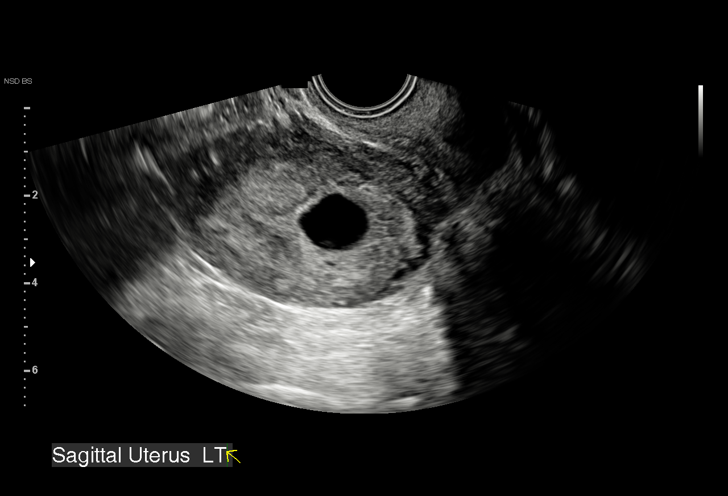
[im 16/35]
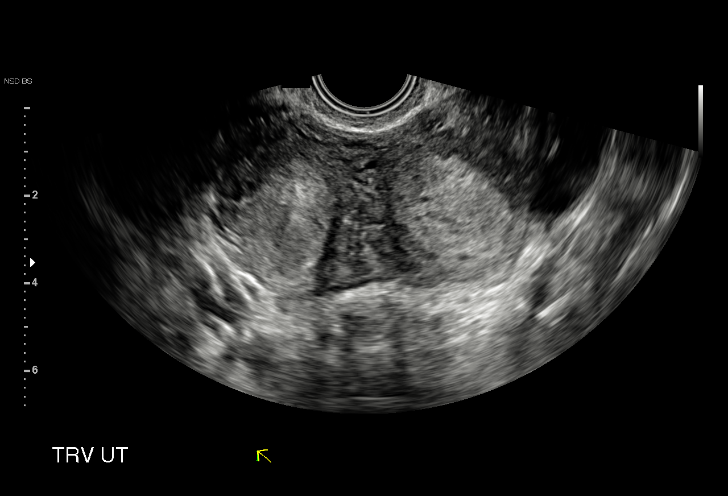
[im 18/35]
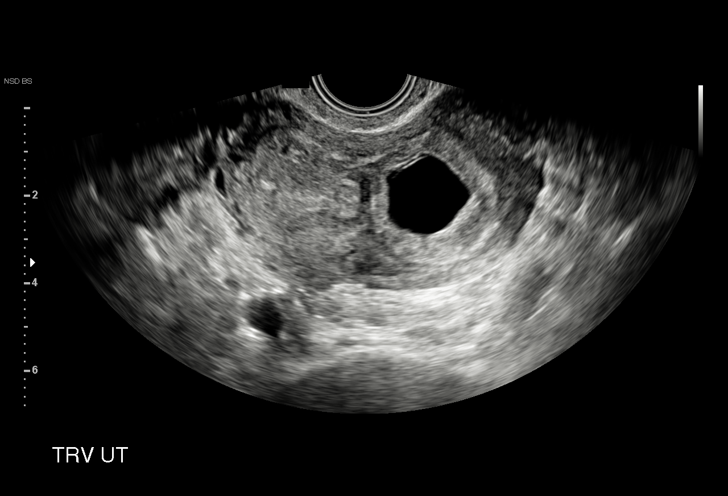
[im 19/35]
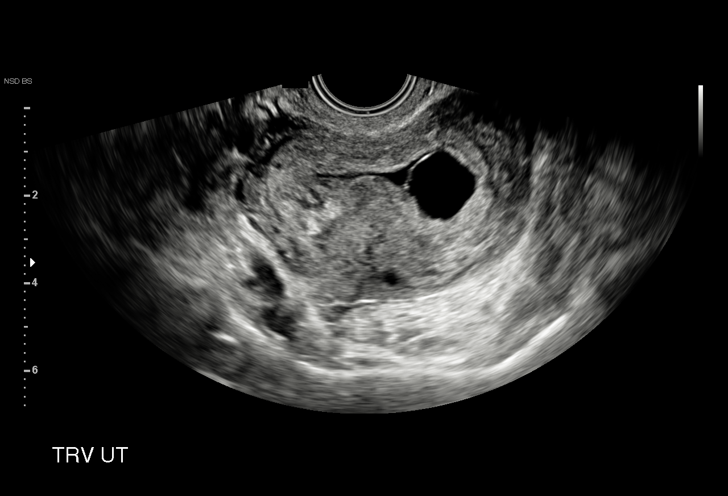
[im 22/35]
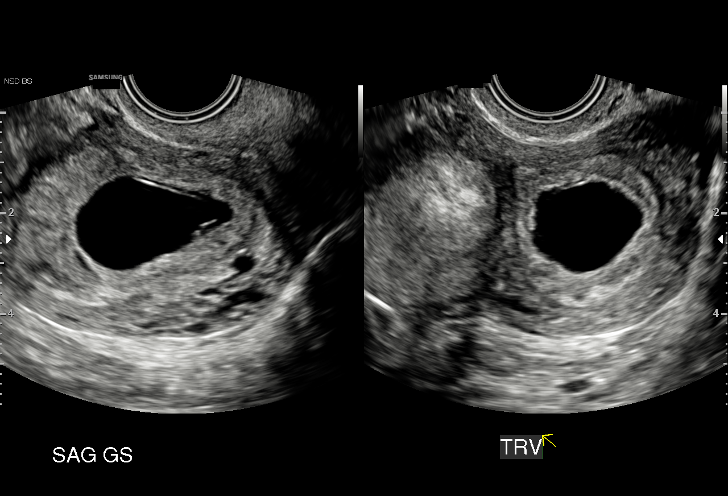
[im 24/35]
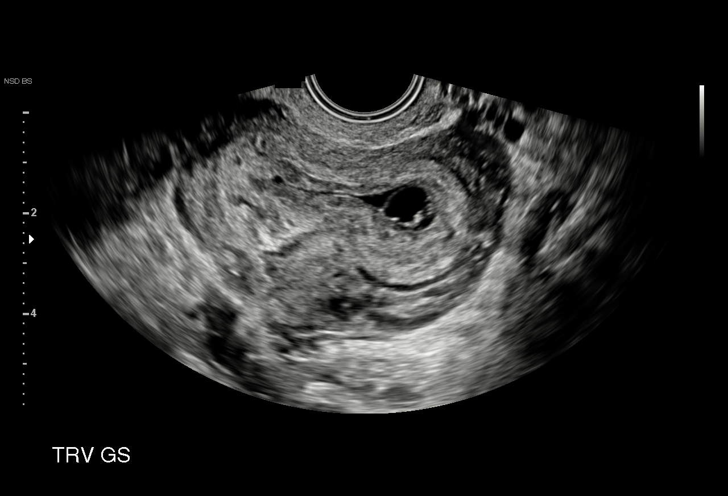
[im 27/35]
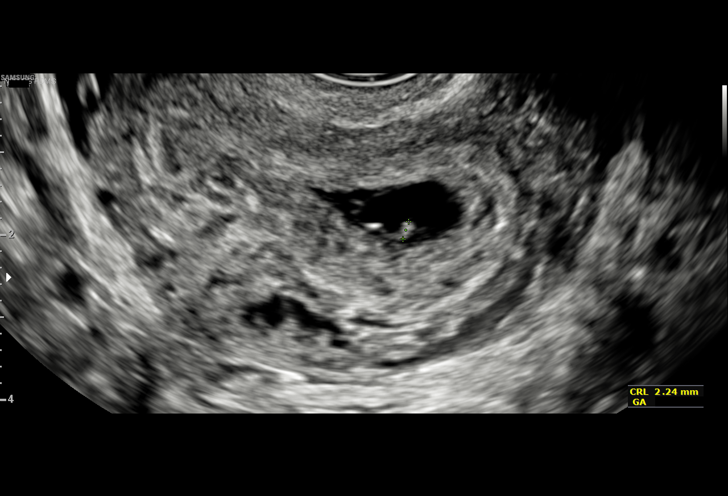
[im 29/35]
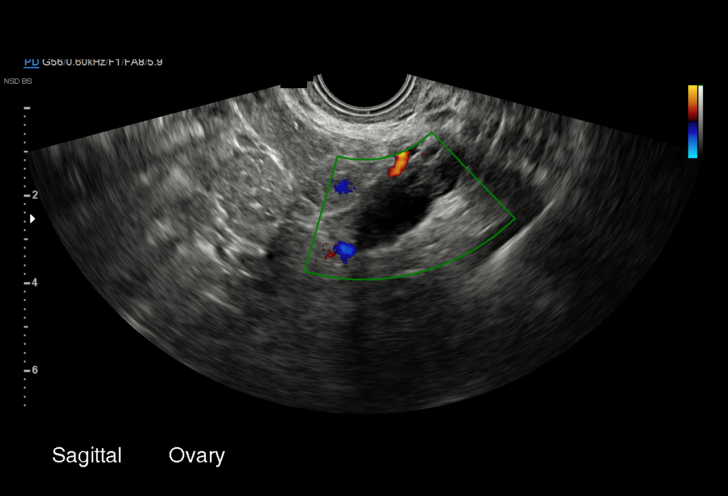
[im 32/35]
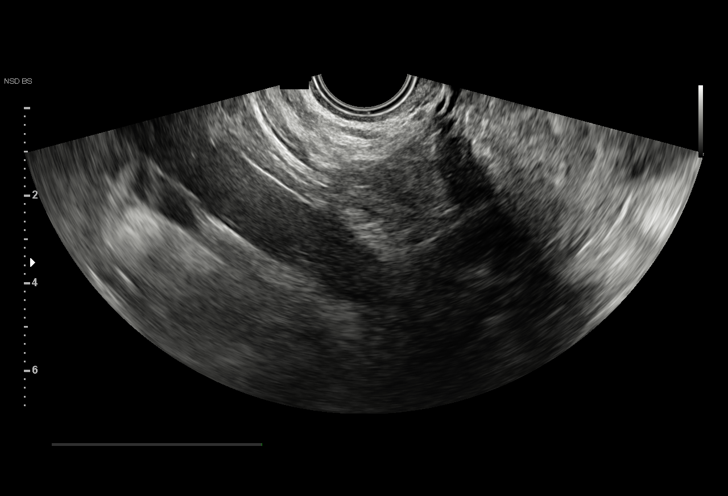
[im 35/35]
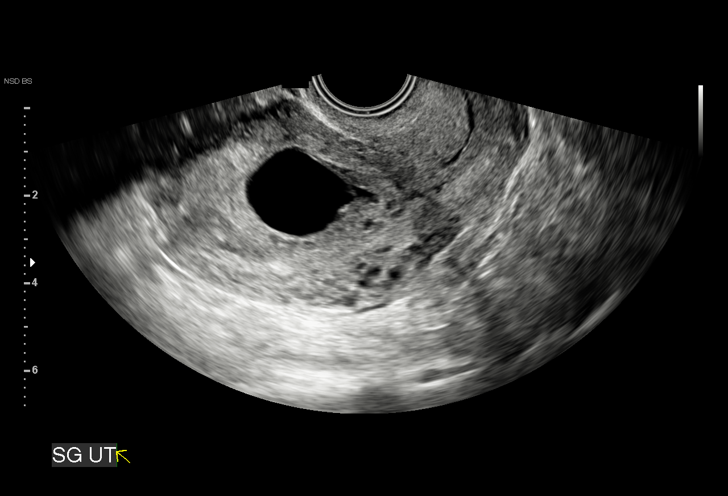

[15 of 28 positions shown; findings below may reference images not displayed]

FINDINGS: LMP: 03/04/2019

GA by LMP: 10 weeks 4 days

EDC by LMP: 12/09/2019

Intrauterine gestational sac: Single

Yolk sac:  No

Embryo:  Questionable visualization of the fetal pole

Cardiac Activity: Not Visualized.

MSD: 24 mm   7 w   2  d

CRL:  2.3 mm   5 w   5 d

Subchorionic hemorrhage:  None visualized.

Maternal uterus/adnexae: Question an arcuate versus partially
septate morphology of the uterus. The gestational sac is positioned
within the left moiety. Ovaries are unremarkable. No free fluid.
IMPRESSION: Suggestion of a fetal pole without visualized cardiac activity or
discernible yolk sac. Findings are suspicious but not yet definitive
for failed pregnancy. Recommend follow-up US in 10-14 days for
definitive diagnosis. This recommendation follows SRU consensus
guidelines: Diagnostic Criteria for Nonviable Pregnancy Early in the
First Trimester. N Engl J Med 3646; [DATE].

## 2020-02-02 IMAGING — US US OB TRANSVAGINAL
1 series · 15 of 28 positions shown · non-contrast
Comparison: 05/16/2012

CLINICAL DATA: Miscarriage, abdominal pain, bleeding.

EXAM:
TRANSVAGINAL OB ULTRASOUND
TECHNIQUE: Transvaginal ultrasound was performed for complete evaluation of the
gestation as well as the maternal uterus, adnexal regions, and
pelvic cul-de-sac.

[Series 1: us ob transvaginal · 38 acquisitions, 15 frames shown]
[im 1/38]
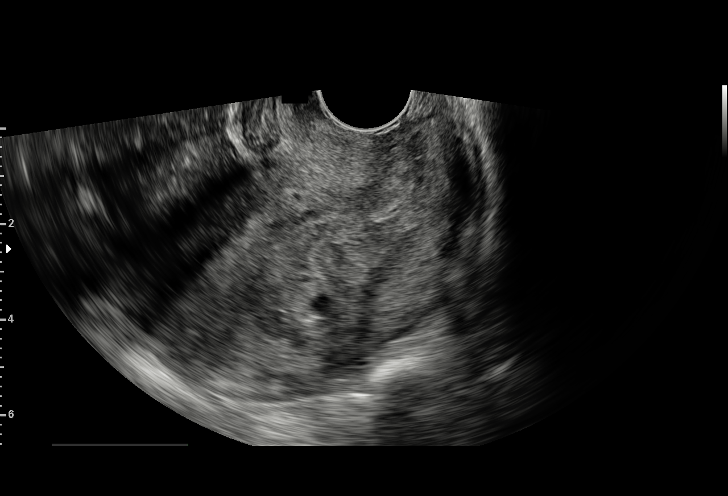
[im 3/38]
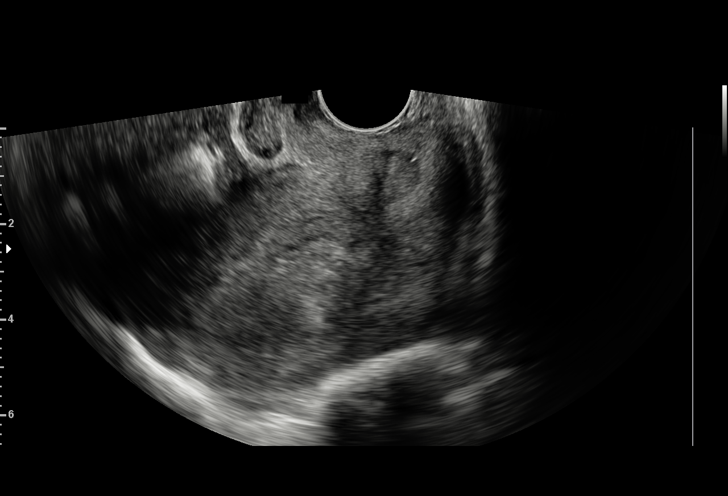
[im 6/38]
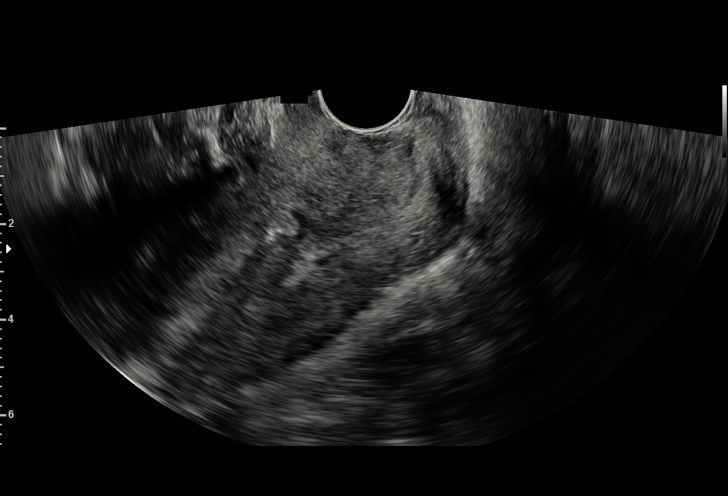
[im 9/38]
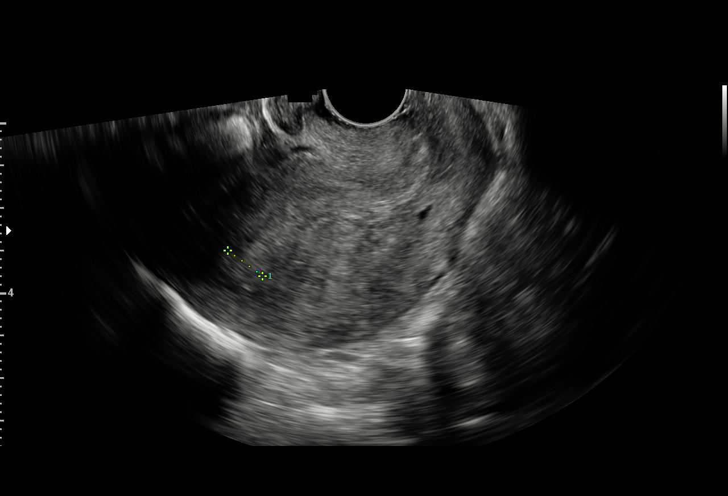
[im 11/38]
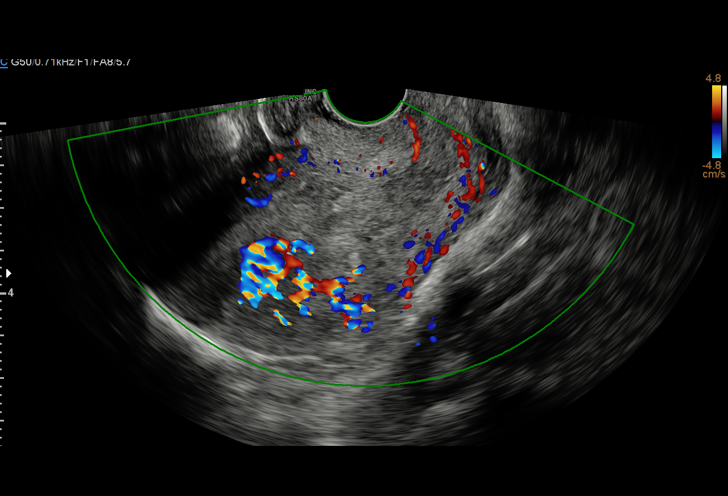
[im 14/38]
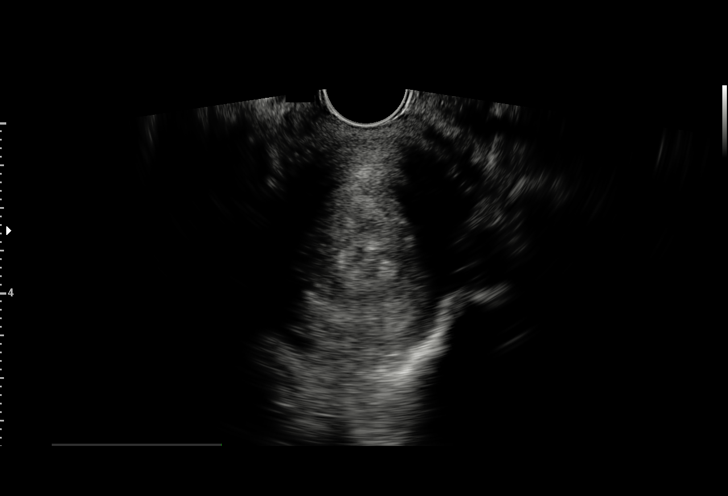
[im 17/38]
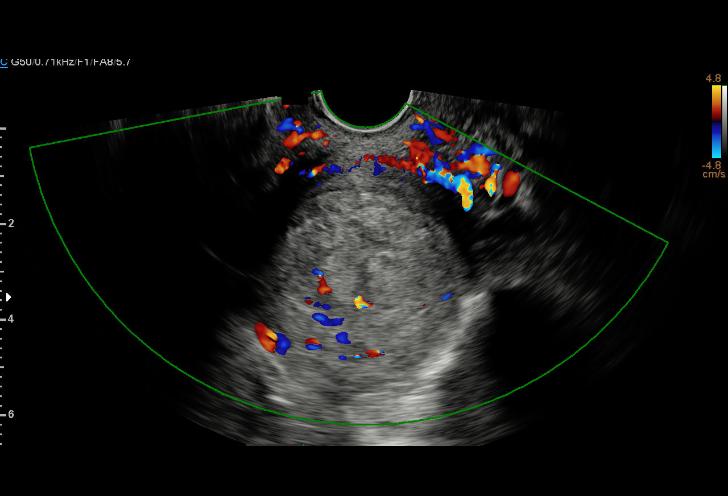
[im 20/38]
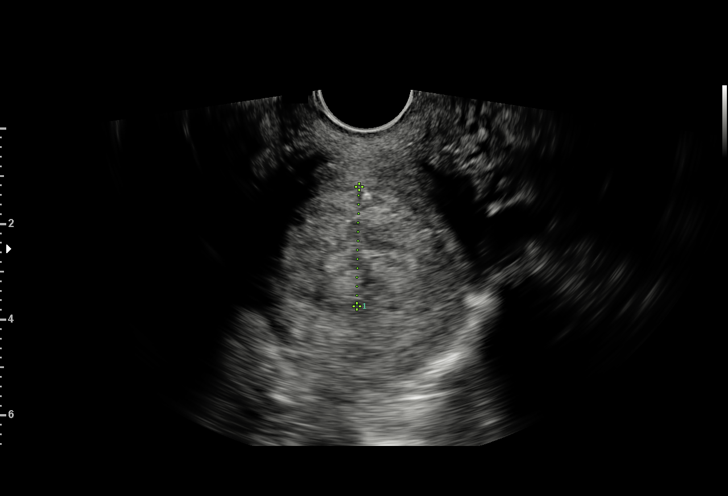
[im 21/38]
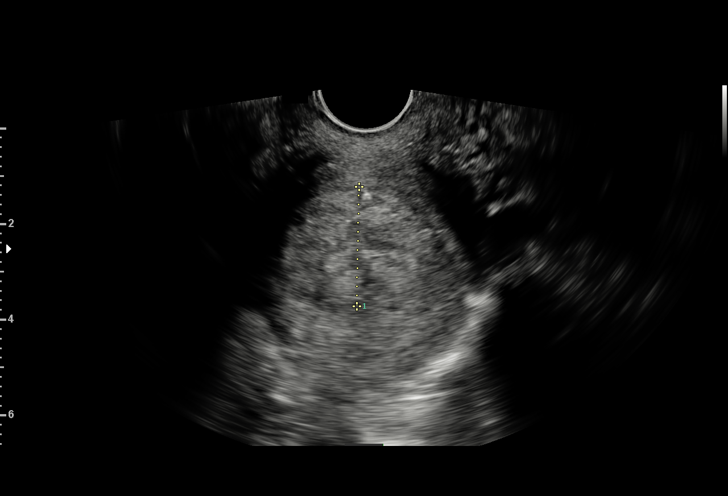
[im 24/38]
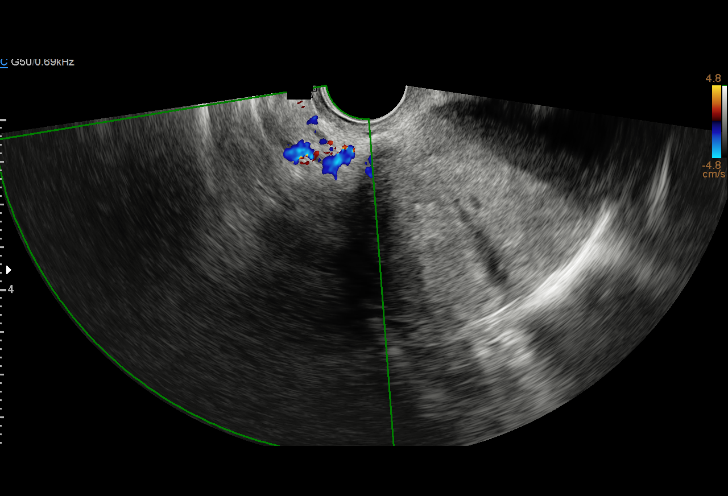
[im 27/38]
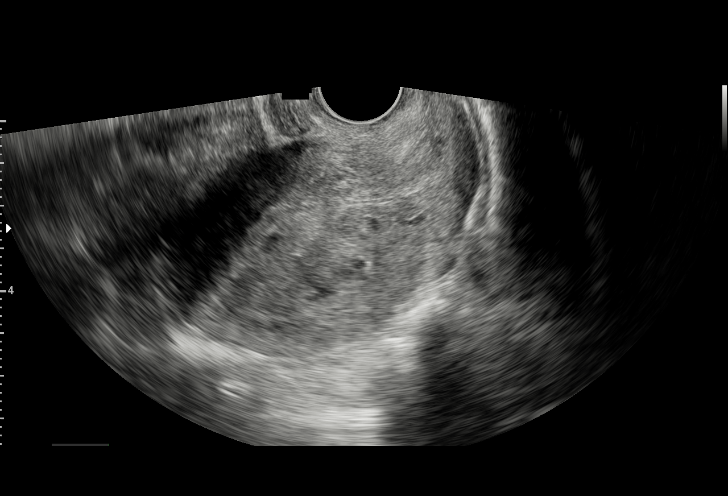
[im 29/38]
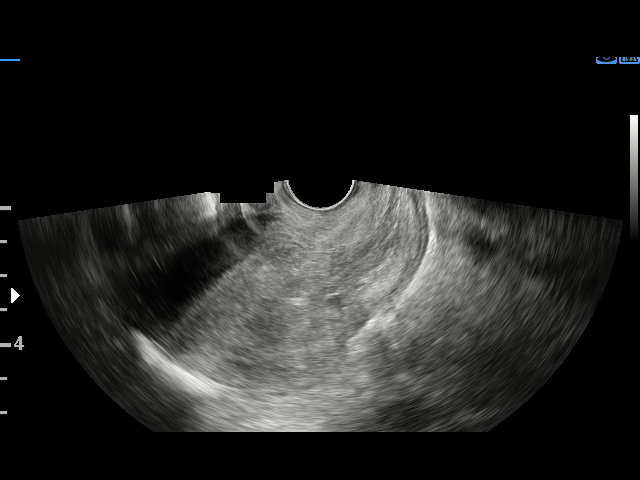
[im 32/38]
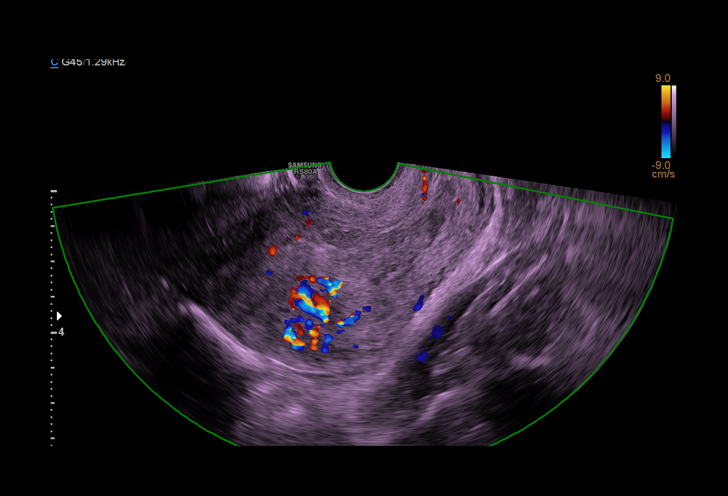
[im 35/38]
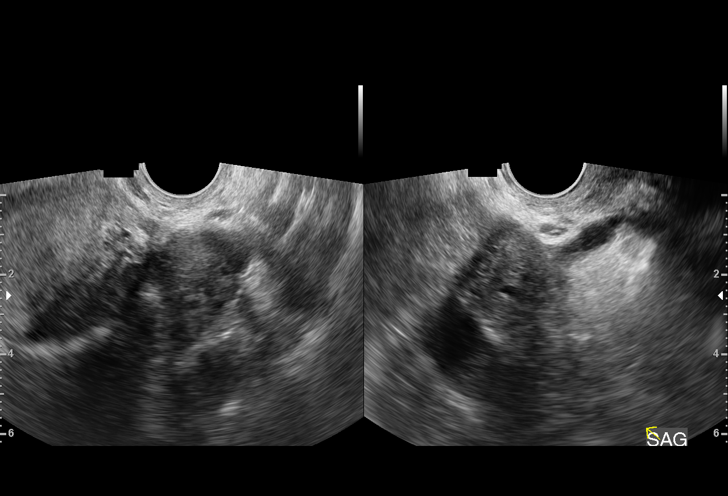
[im 38/38]
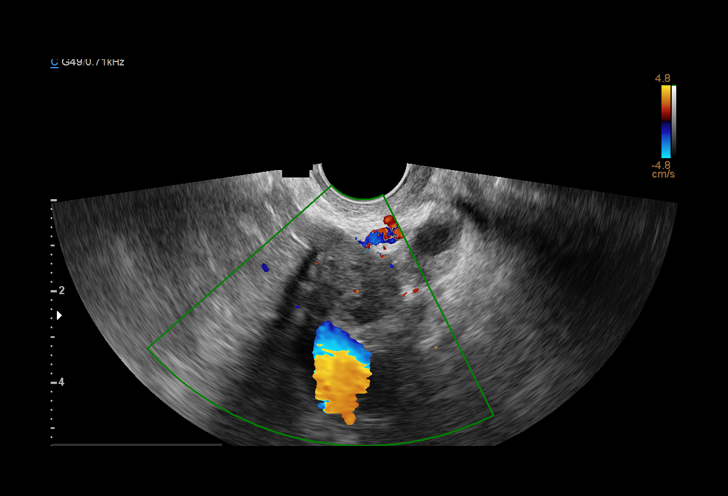

[15 of 28 positions shown; findings below may reference images not displayed]

FINDINGS: Intrauterine gestational sac: None

Yolk sac:  Not Visualized.

Embryo:  Not Visualized.

Cardiac Activity: Not Visualized.

Maternal uterus/adnexae:

Right ovary: Not visualized.

Left ovary: Normal

Other :The endometrium is thickened and heterogeneous in appearance
measuring 2.5 cm. Increased blood flow within the thickened
endometrium identified.

Free fluid:  None
IMPRESSION: 1. Previously noted intrauterine gestational sac is no longer
visualized. Findings meet definitive criteria for failed pregnancy.
This follows SRU consensus guidelines: Diagnostic Criteria for
Nonviable Pregnancy Early in the First Trimester. N Engl J Med
1717;[DATE].
2. Thickened, heterogeneous endometrium measuring 2.5 cm in
thickness with increased blood flow noted. In the appropriate
clinical setting findings are suspicious for retained products of
conception.

## 2021-01-21 ENCOUNTER — Other Ambulatory Visit: Payer: Self-pay

## 2021-01-21 ENCOUNTER — Ambulatory Visit (HOSPITAL_COMMUNITY)
Admission: EM | Admit: 2021-01-21 | Discharge: 2021-01-21 | Disposition: A | Discharge status: Home / Self Care | Payer: BC Managed Care – PPO | Attending: Emergency Medicine | Admitting: Emergency Medicine

## 2021-01-21 DIAGNOSIS — R859 Unspecified abnormal finding in specimens from digestive organs and abdominal cavity: Secondary | ICD-10-CM | POA: Insufficient documentation

## 2021-01-21 DIAGNOSIS — Z113 Encounter for screening for infections with a predominantly sexual mode of transmission: Secondary | ICD-10-CM | POA: Diagnosis not present

## 2021-01-21 NOTE — ED Provider Notes (Signed)
[facility]    CSN: [id number] Arrival date & time: 01/21/21  1448      History   Chief Complaint Chief Complaint  Patient presents with  . Exposure to STD    HPI Zarinah Liberman is a 25 y.o. female.   Patient presents with request for STD testing.  She states she was recently in [place] and kissed someone; no sexual activity.  She states she noticed that her saliva was black after brushing her teeth while in [place] and this continued even as recently as this morning.  She also reports 2-year history of left ear fullness.  She denies fever, chills, sore throat, abdominal pain, dysuria, vaginal discharge, pelvic pain, or other symptoms.  No treatments attempted at home.  No pertinent medical history.  When questioned, patient states she has been using Pepto-Bismol.   The history is provided by the patient.    No past medical history on file.  Patient Active Problem List   Diagnosis Date Noted  . Miscarriage 05/24/2019    No past surgical history on file.  OB History    Gravida  1   Para      Term      Preterm      AB      Living        SAB      IAB      Ectopic      Multiple      Live Births               Home Medications    Prior to Admission medications   Medication Sig Start Date End Date Taking? Authorizing Provider  amoxicillin-clavulanate (AUGMENTIN) 875-125 MG tablet Take 1 tablet by mouth every 12 (twelve) hours. 12/23/19   Dahlia Byes A, NP  Multiple Vitamins-Minerals (MULTIVITAMIN) tablet Take 1 tablet by mouth daily. 05/24/19   Calvert Cantor, CNM  trimethoprim-polymyxin b (POLYTRIM) ophthalmic solution Place 1 drop into the left ear every 4 (four) hours. 12/23/19   Dahlia Byes A, NP  promethazine (PHENERGAN) 25 MG tablet Take 1 tablet (25 mg total) by mouth every 6 (six) hours as needed for nausea or vomiting. 05/24/19 12/23/19  Calvert Cantor, CNM    Family History No family history on file.  Social History Social  History   Tobacco Use  . Smoking status: Never Smoker  . Smokeless tobacco: Never Used  Substance Use Topics  . Alcohol use: Yes    Comment: occ  . Drug use: Never     Allergies   Amoxicillin   Review of Systems Review of Systems  Constitutional: Negative for chills and fever.  HENT: Negative for ear pain, sore throat, trouble swallowing and voice change.        Black saliva  Respiratory: Negative for cough and shortness of breath.   Cardiovascular: Negative for chest pain and palpitations.  Gastrointestinal: Negative for abdominal pain, diarrhea, nausea and vomiting.  Genitourinary: Negative for dysuria, flank pain, hematuria, pelvic pain and vaginal discharge.  Musculoskeletal: Negative for arthralgias and back pain.  Skin: Negative for color change and rash.  All other systems reviewed and are negative.    Physical Exam Triage Vital Signs ED Triage Vitals [01/21/21 1505]  Enc Vitals Group     BP      Pulse      Resp      Temp      Temp src      SpO2  Weight      Height      Head Circumference      Peak Flow      Pain Score 0     Pain Loc      Pain Edu?      Excl. in [place]?    No data found.  Updated Vital Signs BP 113/77 (BP Location: Right Arm)   Pulse 88   Temp 98.8 F (37.1 C) (Oral)   Resp 18   LMP 12/29/2020   SpO2 99%   Breastfeeding No   Visual Acuity Right Eye Distance:   Left Eye Distance:   Bilateral Distance:    Right Eye Near:   Left Eye Near:    Bilateral Near:     Physical Exam Vitals and nursing note reviewed.  Constitutional:      General: She is not in acute distress.    Appearance: She is well-developed. She is not ill-appearing.  HENT:     Head: Normocephalic and atraumatic.     Right Ear: Tympanic membrane and ear canal normal.     Left Ear: Tympanic membrane and ear canal normal.     Nose: Nose normal.     Mouth/Throat:     Mouth: Mucous membranes are moist.     Pharynx: Oropharynx is clear.  Eyes:      Conjunctiva/sclera: Conjunctivae normal.  Cardiovascular:     Rate and Rhythm: Normal rate and regular rhythm.     Heart sounds: Normal heart sounds.  Pulmonary:     Effort: Pulmonary effort is normal. No respiratory distress.     Breath sounds: Normal breath sounds.  Abdominal:     General: Bowel sounds are normal.     Palpations: Abdomen is soft.     Tenderness: There is no abdominal tenderness. There is no right CVA tenderness, left CVA tenderness, guarding or rebound.  Musculoskeletal:     Cervical back: Neck supple.  Skin:    General: Skin is warm and dry.  Neurological:     General: No focal deficit present.     Mental Status: She is alert and oriented to person, place, and time.     Gait: Gait normal.  Psychiatric:        Mood and Affect: Mood normal.        Behavior: Behavior normal.      UC Treatments / Results  Labs (all labs ordered are listed, but only abnormal results are displayed) Labs Reviewed  CERVICOVAGINAL ANCILLARY ONLY  CYTOLOGY, (ORAL, ANAL, URETHRAL) ANCILLARY ONLY    EKG   Radiology No results found.  Procedures Procedures (including critical care time)  Medications Ordered in UC Medications - No data to display  Initial Impression / Assessment and Plan / UC Course  I have reviewed the triage vital signs and the nursing notes.  Pertinent labs & imaging results that were available during my care of the patient were reviewed by me and considered in my medical decision making (see chart for details).   Screening for STDs, saliva abnormality.  Per patient request, oral cavity swabbed for STDs.  Patient obtained vaginal self swab for testing also.  I explained to her that her black saliva is likely due to the Pepto-Bismol use.  She is well-appearing and her exam is reassuring.  I instructed her to abstain from sexual activity until all of her test results are back.  Discussed also that she should follow-up with her PCP if her symptoms are not  improving.  She agrees to plan of care.   Final Clinical Impressions(s) / UC Diagnoses   Final diagnoses:  Screen for STD (sexually transmitted disease)  Saliva abnormality     Discharge Instructions     Your STD tests are pending.  If your test results are positive, we will call you.  You and your sexual partner(s) may require treatment at that time.  Do not have sexual activity for at least 7 days.    Follow up with your primary care provider as needed.           ED Prescriptions    None     PDMP not reviewed this encounter.   Mickie Bail, NP 01/21/21 1625

## 2021-01-21 NOTE — ED Triage Notes (Signed)
Pt is present today with concerns regarding an STD. Pt states that she woke up a couple days ago and went to brush her teeth and noticed her saliva was black. Pt states that she also has left ear fullness and would like her ears evaluated

## 2021-01-21 NOTE — Discharge Instructions (Addendum)
Your STD tests are pending.  If your test results are positive, we will call you.  You and your sexual partner(s) may require treatment at that time.  Do not have sexual activity for at least 7 days.    Follow up with your primary care provider as needed.

## 2021-01-23 LAB — CYTOLOGY, (ORAL, ANAL, URETHRAL) ANCILLARY ONLY
Chlamydia: NEGATIVE
Comment: NEGATIVE
Comment: NEGATIVE
Comment: NORMAL
Neisseria Gonorrhea: NEGATIVE
Trichomonas: NEGATIVE

## 2021-01-24 LAB — CERVICOVAGINAL ANCILLARY ONLY
Bacterial Vaginitis (gardnerella): NEGATIVE
Candida Glabrata: NEGATIVE
Candida Vaginitis: NEGATIVE
Chlamydia: POSITIVE — AB
Comment: NEGATIVE
Comment: NEGATIVE
Comment: NEGATIVE
Comment: NEGATIVE
Comment: NEGATIVE
Comment: NORMAL
Neisseria Gonorrhea: NEGATIVE
Trichomonas: NEGATIVE

## 2021-01-25 ENCOUNTER — Telehealth (HOSPITAL_COMMUNITY): Payer: Self-pay | Admitting: Emergency Medicine

## 2021-01-25 MED ORDER — DOXYCYCLINE HYCLATE 100 MG PO CAPS: 100.0000 mg | ORAL_CAPSULE | Freq: Two times a day (BID) | ORAL | 0 refills | Status: AC

## 2021-02-02 ENCOUNTER — Other Ambulatory Visit: Payer: Self-pay | Admitting: Obstetrics and Gynecology

## 2021-02-02 DIAGNOSIS — N6310 Unspecified lump in the right breast, unspecified quadrant: Secondary | ICD-10-CM

## 2021-02-02 DIAGNOSIS — N6320 Unspecified lump in the left breast, unspecified quadrant: Secondary | ICD-10-CM

## 2021-02-24 ENCOUNTER — Other Ambulatory Visit: Payer: BC Managed Care – PPO

## 2021-04-05 ENCOUNTER — Other Ambulatory Visit: Payer: BC Managed Care – PPO

## 2021-09-27 ENCOUNTER — Encounter (HOSPITAL_COMMUNITY): Payer: Self-pay | Admitting: Emergency Medicine

## 2021-09-27 ENCOUNTER — Ambulatory Visit (HOSPITAL_COMMUNITY)
Admission: EM | Admit: 2021-09-27 | Discharge: 2021-09-27 | Disposition: A | Discharge status: Home / Self Care | Payer: BC Managed Care – PPO | Attending: Family Medicine | Admitting: Family Medicine

## 2021-09-27 ENCOUNTER — Other Ambulatory Visit: Payer: Self-pay

## 2021-09-27 DIAGNOSIS — J020 Streptococcal pharyngitis: Secondary | ICD-10-CM

## 2021-09-27 LAB — POCT RAPID STREP A, ED / UC: Streptococcus, Group A Screen (Direct): POSITIVE — AB

## 2021-09-27 MED ORDER — AZITHROMYCIN 250 MG PO TABS: 250.0000 mg | ORAL_TABLET | Freq: Every day | ORAL | 0 refills | Status: DC

## 2021-09-27 NOTE — ED Triage Notes (Signed)
PT reports sore throat and fever, started Monday.

## 2021-09-27 NOTE — Discharge Instructions (Signed)
Your strep was positive today.  I have sent out an antibiotic for you today.  Please get a new tooth brush in 48hrs, and clean/launder your pillow cases as well.  Tylenol/motrin for pain and salt water gargles as well.

## 2021-09-27 NOTE — ED Provider Notes (Signed)
[facility]    CSN: [id number] Arrival date & time: 09/27/21  1338      History   Chief Complaint Chief Complaint  Patient presents with   Sore Throat   Fever    HPI Cynthia Reeves is a 26 y.o. female.   Patient is here for sore throat and fever.  Woke up 2 days ago with sore throat, temp of 101. Also with body aches;   No runny nose, congestion.  No cough.  Taking nyquil and dayquil for symptoms.   History reviewed. No pertinent past medical history.  Patient Active Problem List   Diagnosis Date Noted   Miscarriage 05/24/2019    History reviewed. No pertinent surgical history.  OB History     Gravida  1   Para      Term      Preterm      AB      Living         SAB      IAB      Ectopic      Multiple      Live Births               Home Medications    Prior to Admission medications   Medication Sig Start Date End Date Taking? Authorizing Provider  amoxicillin-clavulanate (AUGMENTIN) 875-125 MG tablet Take 1 tablet by mouth every 12 (twelve) hours. 12/23/19   Dahlia Byes A, NP  Multiple Vitamins-Minerals (MULTIVITAMIN) tablet Take 1 tablet by mouth daily. 05/24/19   Calvert Cantor, CNM  trimethoprim-polymyxin b (POLYTRIM) ophthalmic solution Place 1 drop into the left ear every 4 (four) hours. 12/23/19   Dahlia Byes A, NP  promethazine (PHENERGAN) 25 MG tablet Take 1 tablet (25 mg total) by mouth every 6 (six) hours as needed for nausea or vomiting. 05/24/19 12/23/19  Calvert Cantor, CNM    Family History No family history on file.  Social History Social History   Tobacco Use   Smoking status: Never   Smokeless tobacco: Never  Substance Use Topics   Alcohol use: Yes    Comment: occ   Drug use: Never     Allergies   Amoxicillin   Review of Systems Review of Systems  Constitutional:  Positive for fever.  HENT:  Positive for sore throat. Negative for congestion and rhinorrhea.   Eyes: Negative.    Respiratory: Negative.    Cardiovascular: Negative.   Gastrointestinal: Negative.     Physical Exam Triage Vital Signs ED Triage Vitals  Enc Vitals Group     BP 09/27/21 1349 130/74     Pulse Rate 09/27/21 1349 99     Resp 09/27/21 1349 16     Temp 09/27/21 1349 99.7 F (37.6 C)     Temp Source 09/27/21 1349 Oral     SpO2 09/27/21 1349 99 %     Weight --      Height --      Head Circumference --      Peak Flow --      Pain Score 09/27/21 1347 7     Pain Loc --      Pain Edu? --      Excl. in GC? --    No data found.  Updated Vital Signs BP 130/74    Pulse 99    Temp 99.7 F (37.6 C) (Oral)    Resp 16    LMP 09/14/2021    SpO2 99%  Physical Exam Constitutional:      Appearance: She is well-developed.  HENT:     Head: Normocephalic.     Right Ear: Tympanic membrane normal.     Left Ear: Tympanic membrane normal.     Nose: No congestion or rhinorrhea.     Mouth/Throat:     Pharynx: Pharyngeal swelling and posterior oropharyngeal erythema present.     Tonsils: Tonsillar exudate present. 2+ on the right. 2+ on the left.  Cardiovascular:     Rate and Rhythm: Normal rate and regular rhythm.  Pulmonary:     Effort: Pulmonary effort is normal.     Breath sounds: Normal breath sounds.  Musculoskeletal:     Cervical back: Normal range of motion.  Neurological:     Mental Status: She is alert.     UC Treatments / Results  Labs (all labs ordered are listed, but only abnormal results are displayed) Labs Reviewed  POCT RAPID STREP A, ED / UC    EKG   Radiology No results found.  Procedures Procedures (including critical care time)  Medications Ordered in UC Medications - No data to display  Initial Impression / Assessment and Plan / UC Course  I have reviewed the triage vital signs and the nursing notes.  Pertinent labs & imaging results that were available during my care of the patient were reviewed by me and considered in my medical decision  making (see chart for details).   Patient was seen for sore throat.  Strep was positive today.  Allergy to PCN per patient, rx for zithromax given today.  Supportive care as well.   Final Clinical Impressions(s) / UC Diagnoses   Final diagnoses:  Streptococcal sore throat     Discharge Instructions      Your strep was positive today.  I have sent out an antibiotic for you today.  Please get a new tooth brush in 48hrs, and clean/launder your pillow cases as well.  Tylenol/motrin for pain and salt water gargles as well.      ED Prescriptions     Medication Sig Dispense Auth. Provider   azithromycin (ZITHROMAX) 250 MG tablet Take 1 tablet (250 mg total) by mouth daily. Take first 2 tablets together, then 1 every day until finished. 6 tablet Jannifer Franklin, MD      PDMP not reviewed this encounter.   Jannifer Franklin, MD 09/27/21 1413

## 2021-11-02 ENCOUNTER — Ambulatory Visit (HOSPITAL_COMMUNITY)
Admission: EM | Admit: 2021-11-02 | Discharge: 2021-11-02 | Disposition: A | Discharge status: Home / Self Care | Payer: BC Managed Care – PPO | Attending: Physician Assistant | Admitting: Physician Assistant

## 2021-11-02 ENCOUNTER — Encounter (HOSPITAL_COMMUNITY): Payer: Self-pay

## 2021-11-02 ENCOUNTER — Other Ambulatory Visit: Payer: Self-pay

## 2021-11-02 DIAGNOSIS — J029 Acute pharyngitis, unspecified: Secondary | ICD-10-CM

## 2021-11-02 DIAGNOSIS — R051 Acute cough: Secondary | ICD-10-CM | POA: Diagnosis not present

## 2021-11-02 DIAGNOSIS — J069 Acute upper respiratory infection, unspecified: Secondary | ICD-10-CM

## 2021-11-02 DIAGNOSIS — R0981 Nasal congestion: Secondary | ICD-10-CM | POA: Diagnosis not present

## 2021-11-02 LAB — POC INFLUENZA A AND B ANTIGEN (URGENT CARE ONLY)
INFLUENZA A ANTIGEN, POC: NEGATIVE
INFLUENZA B ANTIGEN, POC: NEGATIVE

## 2021-11-02 LAB — POCT RAPID STREP A, ED / UC: Streptococcus, Group A Screen (Direct): NEGATIVE

## 2021-11-02 MED ORDER — PROMETHAZINE-DM 6.25-15 MG/5ML PO SYRP: 5.0000 mL | ORAL_SOLUTION | Freq: Two times a day (BID) | ORAL | 0 refills | Status: DC | PRN

## 2021-11-02 NOTE — Discharge Instructions (Signed)
We will contact you if you are positive for the flu.  Please monitor your MyChart for this result.  Take an at-home COVID test when you get home.  If this is positive you need to remain in isolation for 5 days and then wear a N95 or K N95 mask for an additional 5 days.  Use Promethazine DM for cough.  This can make you sleepy so do not drive or drink alcohol while taking it.  Use Mucinex and Flonase for symptom relief.  Make sure you rest and drink plenty of fluid.  If you develop any severe sore throat, chest pain, shortness of breath, high fever, nausea/vomiting, difficulty swallowing, muffled voice you should be seen immediately.

## 2021-11-02 NOTE — ED Provider Notes (Signed)
[place]    CSN: [id number] Arrival date & time: 11/02/21  [id number]      History   Chief Complaint Chief Complaint  Patient presents with   Sore Throat   Generalized Body Aches    HPI Cynthia Reeves is a 26 y.o. female.   Patient presents today with a 1 day history of URI symptoms.  She reports sore throat, body aches, subjective fever, cough, congestion.  She denies any chest pain, shortness of breath, nausea, vomiting, diarrhea.  She denies any known sick contacts.  She has not tried any over-the-counter medication for symptom management.  She has had COVID-19 and influenza vaccine.  She had COVID in 2020.  She was recently seen within the past month for strep pharyngitis and completed course of azithromycin with improvement of symptoms.  She is eating and drinking despite ongoing sore throat.  Denies any history of allergies, asthma, smoking, COPD.  She is confident that she is not pregnant.   History reviewed. No pertinent past medical history.  Patient Active Problem List   Diagnosis Date Noted   Miscarriage 05/24/2019    History reviewed. No pertinent surgical history.  OB History     Gravida  1   Para      Term      Preterm      AB      Living         SAB      IAB      Ectopic      Multiple      Live Births               Home Medications    Prior to Admission medications   Medication Sig Start Date End Date Taking? Authorizing Provider  promethazine-dextromethorphan (PROMETHAZINE-DM) 6.25-15 MG/5ML syrup Take 5 mLs by mouth 2 (two) times daily as needed for cough. 11/02/21  Yes Lamine Laton, Junie Panning K, PA-C  Multiple Vitamins-Minerals (MULTIVITAMIN) tablet Take 1 tablet by mouth daily. 05/24/19   Darlina Rumpf, CNM  promethazine (PHENERGAN) 25 MG tablet Take 1 tablet (25 mg total) by mouth every 6 (six) hours as needed for nausea or vomiting. 05/24/19 12/23/19  Darlina Rumpf, CNM    Family History History reviewed. No  pertinent family history.  Social History Social History   Tobacco Use   Smoking status: Never   Smokeless tobacco: Never  Substance Use Topics   Alcohol use: Yes    Comment: occ   Drug use: Never     Allergies   Amoxicillin   Review of Systems Review of Systems  Constitutional:  Positive for activity change, fatigue and fever. Negative for appetite change.  HENT:  Positive for congestion and sore throat. Negative for sinus pressure and sneezing.   Respiratory:  Positive for cough. Negative for shortness of breath.   Cardiovascular:  Negative for chest pain.  Gastrointestinal:  Negative for abdominal pain, diarrhea, nausea and vomiting.  Musculoskeletal:  Negative for arthralgias and myalgias.  Neurological:  Positive for headaches. Negative for dizziness and light-headedness.    Physical Exam Triage Vital Signs ED Triage Vitals  Enc Vitals Group     BP 11/02/21 1140 (!) 92/51     Pulse Rate 11/02/21 1140 78     Resp 11/02/21 1140 18     Temp 11/02/21 1140 98.3 F (36.8 C)     Temp Source 11/02/21 1140 Oral     SpO2 11/02/21 1140 100 %     Weight --  Height --      Head Circumference --      Peak Flow --      Pain Score 11/02/21 1141 2     Pain Loc --      Pain Edu? --      Excl. in [place]? --    No data found.  Updated Vital Signs BP 103/69 (BP Location: Left Arm)    Pulse 78    Temp 98.3 F (36.8 C) (Oral)    Resp 18    LMP 10/12/2021    SpO2 100%   Visual Acuity Right Eye Distance:   Left Eye Distance:   Bilateral Distance:    Right Eye Near:   Left Eye Near:    Bilateral Near:     Physical Exam Vitals reviewed.  Constitutional:      General: She is awake. She is not in acute distress.    Appearance: Normal appearance. She is well-developed. She is not ill-appearing.     Comments: Very pleasant female appears stated age in no acute distress sitting comfortably in exam room  HENT:     Head: Normocephalic and atraumatic.     Right Ear:  Tympanic membrane, ear canal and external ear normal. Tympanic membrane is not erythematous or bulging.     Left Ear: Tympanic membrane, ear canal and external ear normal. Tympanic membrane is not erythematous or bulging.     Nose:     Right Sinus: No maxillary sinus tenderness or frontal sinus tenderness.     Left Sinus: No maxillary sinus tenderness or frontal sinus tenderness.     Mouth/Throat:     Pharynx: Uvula midline. Posterior oropharyngeal erythema present. No oropharyngeal exudate.     Tonsils: 2+ on the right. 2+ on the left.  Cardiovascular:     Rate and Rhythm: Normal rate and regular rhythm.     Heart sounds: Normal heart sounds, S1 normal and S2 normal. No murmur heard. Pulmonary:     Effort: Pulmonary effort is normal.     Breath sounds: Normal breath sounds. No wheezing, rhonchi or rales.     Comments: Clear to auscultation bilaterally Psychiatric:        Behavior: Behavior is cooperative.     UC Treatments / Results  Labs (all labs ordered are listed, but only abnormal results are displayed) Labs Reviewed  CULTURE, GROUP A STREP [facility])  POCT RAPID STREP A, ED / UC  POC INFLUENZA A AND B ANTIGEN (URGENT CARE ONLY)    EKG   Radiology No results found.  Procedures Procedures (including critical care time)  Medications Ordered in UC Medications - No data to display  Initial Impression / Assessment and Plan / UC Course  I have reviewed the triage vital signs and the nursing notes.  Pertinent labs & imaging results that were available during my care of the patient were reviewed by me and considered in my medical decision making (see chart for details).     Discussed likely viral etiology given clinical presentation.  Due to history of recurrent strep strep testing was performed and was negative.  Throat culture is pending but will defer antibiotic use until these results are available.  No evidence of acute infection on physical exam that would warrant  initiation of antibiotics.  Discussed likely viral nature of symptoms with patient.  Flu testing was negative.  Offered patient COVID test but she declined this.  She does had an at-home COVID test and encouraged her to use  1 and discussed CDC return to work guidelines based on this result.  She can use over-the-counter medications including Tylenol, Mucinex, Flonase for symptom relief.  She was prescribed Promethazine DM for cough with instruction not to drive or drink alcohol while taking this as drowsiness is a common side effect.  Discussed that if symptoms are not improving by next week she should return or see her PCP.  Discussed alarm symptoms that warrant emergent evaluation including high fever not respond to medication, chest pain, shortness of breath, nausea/vomiting interfering with oral intake, weakness.  Strict return precautions given to which she expressed understanding.  Work excuse note provided.  Final Clinical Impressions(s) / UC Diagnoses   Final diagnoses:  Upper respiratory tract infection, unspecified type  Sore throat  Nasal congestion  Acute cough     Discharge Instructions      We will contact you if you are positive for the flu.  Please monitor your MyChart for this result.  Take an at-home COVID test when you get home.  If this is positive you need to remain in isolation for 5 days and then wear a N95 or K N95 mask for an additional 5 days.  Use Promethazine DM for cough.  This can make you sleepy so do not drive or drink alcohol while taking it.  Use Mucinex and Flonase for symptom relief.  Make sure you rest and drink plenty of fluid.  If you develop any severe sore throat, chest pain, shortness of breath, high fever, nausea/vomiting, difficulty swallowing, muffled voice you should be seen immediately.     ED Prescriptions     Medication Sig Dispense Auth. Provider   promethazine-dextromethorphan (PROMETHAZINE-DM) 6.25-15 MG/5ML syrup Take 5 mLs by mouth 2 (two)  times daily as needed for cough. 118 mL Alesi Zachery K, PA-C      PDMP not reviewed this encounter.   Terrilee Croak, PA-C 11/02/21 1238

## 2021-11-02 NOTE — ED Triage Notes (Signed)
Pt reports sore itchy throat and body aches x 2 days.

## 2021-11-04 LAB — CULTURE, GROUP A STREP (THRC)

## 2022-03-22 ENCOUNTER — Encounter (HOSPITAL_COMMUNITY): Payer: Self-pay | Admitting: Emergency Medicine

## 2022-03-22 ENCOUNTER — Ambulatory Visit (HOSPITAL_COMMUNITY)
Admission: EM | Admit: 2022-03-22 | Discharge: 2022-03-22 | Disposition: A | Discharge status: Home / Self Care | Payer: BC Managed Care – PPO | Attending: Student | Admitting: Student

## 2022-03-22 DIAGNOSIS — Z202 Contact with and (suspected) exposure to infections with a predominantly sexual mode of transmission: Secondary | ICD-10-CM

## 2022-03-22 DIAGNOSIS — Z3202 Encounter for pregnancy test, result negative: Secondary | ICD-10-CM

## 2022-03-22 DIAGNOSIS — Z113 Encounter for screening for infections with a predominantly sexual mode of transmission: Secondary | ICD-10-CM | POA: Diagnosis present

## 2022-03-22 LAB — POC URINE PREG, ED: Preg Test, Ur: NEGATIVE

## 2022-03-22 NOTE — ED Triage Notes (Signed)
Pt is present today for STD testing. Pt denies any sx  

## 2022-03-22 NOTE — ED Provider Notes (Signed)
[facility]    CSN: [id number] Arrival date & time: 03/22/22  1109      History   Chief Complaint Chief Complaint  Patient presents with   SEXUALLY TRANSMITTED DISEASE    HPI Cynthia Reeves is a 26 y.o. female presenting for STI screen. History noncontributory. States she has a new female partner and requesting screening. Last encounter with him was 2 days ago; prior to this had not been sexually active in 9 months so low concern for STI. LMP 03/15/22. Denies hematuria, dysuria, frequency, urgency, back pain, n/v/d/abd pain, fevers/chills, abdnormal vaginal discharge.    HPI  History reviewed. No pertinent past medical history.  Patient Active Problem List   Diagnosis Date Noted   Miscarriage 05/24/2019    History reviewed. No pertinent surgical history.  OB History     Gravida  1   Para      Term      Preterm      AB      Living         SAB      IAB      Ectopic      Multiple      Live Births               Home Medications    Prior to Admission medications   Medication Sig Start Date End Date Taking? Authorizing Provider  Multiple Vitamins-Minerals (MULTIVITAMIN) tablet Take 1 tablet by mouth daily. 05/24/19   Calvert Cantor, CNM  promethazine-dextromethorphan (PROMETHAZINE-DM) 6.25-15 MG/5ML syrup Take 5 mLs by mouth 2 (two) times daily as needed for cough. 11/02/21   Raspet, Noberto Retort, PA-C  promethazine (PHENERGAN) 25 MG tablet Take 1 tablet (25 mg total) by mouth every 6 (six) hours as needed for nausea or vomiting. 05/24/19 12/23/19  Calvert Cantor, CNM    Family History History reviewed. No pertinent family history.  Social History Social History   Tobacco Use   Smoking status: Never   Smokeless tobacco: Never  Substance Use Topics   Alcohol use: Yes    Comment: occ   Drug use: Never     Allergies   Amoxicillin   Review of Systems Review of Systems  Constitutional:  Negative for chills and fever.  HENT:   Negative for sore throat.   Eyes:  Negative for pain and redness.  Respiratory:  Negative for shortness of breath.   Cardiovascular:  Negative for chest pain.  Gastrointestinal:  Negative for abdominal pain, diarrhea, nausea and vomiting.  Genitourinary:  Negative for decreased urine volume, difficulty urinating, dysuria, flank pain, frequency, genital sores, hematuria, menstrual problem, pelvic pain, urgency, vaginal bleeding, vaginal discharge and vaginal pain.  Musculoskeletal:  Negative for back pain.  Skin:  Negative for rash.  All other systems reviewed and are negative.    Physical Exam Triage Vital Signs ED Triage Vitals  Enc Vitals Group     BP 03/22/22 1136 125/73     Pulse Rate 03/22/22 1136 72     Resp 03/22/22 1136 18     Temp 03/22/22 1136 99 F (37.2 C)     Temp src --      SpO2 03/22/22 1136 98 %     Weight --      Height --      Head Circumference --      Peak Flow --      Pain Score 03/22/22 1135 0     Pain Loc --  Pain Edu? --      Excl. in GC? --    No data found.  Updated Vital Signs BP 125/73   Pulse 72   Temp 99 F (37.2 C)   Resp 18   LMP 03/15/2022   SpO2 98%   Visual Acuity Right Eye Distance:   Left Eye Distance:   Bilateral Distance:    Right Eye Near:   Left Eye Near:    Bilateral Near:     Physical Exam Vitals reviewed.  Constitutional:      General: She is not in acute distress.    Appearance: Normal appearance. She is not ill-appearing.  HENT:     Head: Normocephalic and atraumatic.     Mouth/Throat:     Mouth: Mucous membranes are moist.     Comments: Moist mucous membranes Eyes:     Extraocular Movements: Extraocular movements intact.     Pupils: Pupils are equal, round, and reactive to light.  Cardiovascular:     Rate and Rhythm: Normal rate and regular rhythm.     Heart sounds: Normal heart sounds.  Pulmonary:     Effort: Pulmonary effort is normal.     Breath sounds: Normal breath sounds. No wheezing,  rhonchi or rales.  Abdominal:     General: Bowel sounds are normal. There is no distension.     Palpations: Abdomen is soft. There is no mass.     Tenderness: There is no abdominal tenderness. There is no right CVA tenderness, left CVA tenderness, guarding or rebound.  Genitourinary:    Comments: deferred Skin:    General: Skin is warm.     Capillary Refill: Capillary refill takes less than 2 seconds.     Comments: Good skin turgor  Neurological:     General: No focal deficit present.     Mental Status: She is alert and oriented to person, place, and time.  Psychiatric:        Mood and Affect: Mood normal.        Behavior: Behavior normal.      UC Treatments / Results  Labs (all labs ordered are listed, but only abnormal results are displayed) Labs Reviewed  POC URINE PREG, ED  CERVICOVAGINAL ANCILLARY ONLY    EKG   Radiology No results found.  Procedures Procedures (including critical care time)  Medications Ordered in UC Medications - No data to display  Initial Impression / Assessment and Plan / UC Course  I have reviewed the triage vital signs and the nursing notes.  Pertinent labs & imaging results that were available during my care of the patient were reviewed by me and considered in my medical decision making (see chart for details).     This patient is a very pleasant 26 y.o. year old female presenting with STI screening - asymptomatic. Afebrile, nontachycardic, no reproducible abd pain or CVAT.  U-preg negative Will send self-swab for G/C, trich, yeast, BV testing. Declines HIV, RPR. Safe sex precautions.   Given last unprotected intercourse was 2 days ago, she understands that any STI contracted at that time will likely not appear on today's screen. She should return in about 1 week if desired for recheck.  ED return precautions discussed. Patient verbalizes understanding and agreement.     Final Clinical Impressions(s) / UC Diagnoses   Final  diagnoses:  Routine screening for STI (sexually transmitted infection)     Discharge Instructions      -We have sent testing for sexually transmitted infections.  We will notify you of any positive results once they are received (typically in 2-3 days). If required, we will prescribe any medications you might need. Please refrain from all sexual activity until treatment is complete.  -Seek additional medical attention if you develop fevers/chills, new/worsening abdominal pain, new/worsening vaginal discomfort/discharge, etc.     ED Prescriptions   None    PDMP not reviewed this encounter.   Rhys Martini, PA-C 03/22/22 1214

## 2022-03-22 NOTE — Discharge Instructions (Signed)
-  We have sent testing for sexually transmitted infections. We will notify you of any positive results once they are received (typically in 2-3 days). If required, we will prescribe any medications you might need. Please refrain from all sexual activity until treatment is complete.  -Seek additional medical attention if you develop fevers/chills, new/worsening abdominal pain, new/worsening vaginal discomfort/discharge, etc.

## 2022-03-23 LAB — CERVICOVAGINAL ANCILLARY ONLY
Bacterial Vaginitis (gardnerella): NEGATIVE
Candida Glabrata: NEGATIVE
Candida Vaginitis: NEGATIVE
Chlamydia: NEGATIVE
Comment: NEGATIVE
Comment: NEGATIVE
Comment: NEGATIVE
Comment: NEGATIVE
Comment: NEGATIVE
Comment: NORMAL
Neisseria Gonorrhea: NEGATIVE
Trichomonas: NEGATIVE

## 2022-03-27 ENCOUNTER — Ambulatory Visit (HOSPITAL_COMMUNITY)
Admission: EM | Admit: 2022-03-27 | Discharge: 2022-03-27 | Disposition: A | Discharge status: Home / Self Care | Payer: BC Managed Care – PPO | Attending: Student | Admitting: Student

## 2022-03-27 ENCOUNTER — Encounter (HOSPITAL_COMMUNITY): Payer: Self-pay | Admitting: Emergency Medicine

## 2022-03-27 DIAGNOSIS — Z202 Contact with and (suspected) exposure to infections with a predominantly sexual mode of transmission: Secondary | ICD-10-CM | POA: Diagnosis not present

## 2022-03-27 DIAGNOSIS — Z3202 Encounter for pregnancy test, result negative: Secondary | ICD-10-CM

## 2022-03-27 DIAGNOSIS — Z113 Encounter for screening for infections with a predominantly sexual mode of transmission: Secondary | ICD-10-CM | POA: Diagnosis not present

## 2022-03-27 DIAGNOSIS — Z7251 High risk heterosexual behavior: Secondary | ICD-10-CM | POA: Insufficient documentation

## 2022-03-27 LAB — HIV ANTIBODY (ROUTINE TESTING W REFLEX): HIV Screen 4th Generation wRfx: NONREACTIVE

## 2022-03-27 LAB — POC URINE PREG, ED: Preg Test, Ur: NEGATIVE

## 2022-03-27 NOTE — ED Provider Notes (Signed)
[facility]    CSN: [id number] Arrival date & time: 03/27/22  1052      History   Chief Complaint Chief Complaint  Patient presents with   SEXUALLY TRANSMITTED DISEASE    HPI Bridgitte Felicetti is a 26 y.o. female presenting for STI screen. We last saw her for this on 03/22/2022, her last unprotected sexual encounter was on 03/20/22. STI screen was negative 03/20/22, but we advised following-up in 1.5 weeks for recheck.  Today she is still without vaginal or urinary symptoms, though she does have new onset of sore throat. Denies hematuria, dysuria, frequency, urgency, back pain, n/v/d/abd pain, fevers/chills, abdnormal vaginal discharge. Denies cough, congestion.  HPI  History reviewed. No pertinent past medical history.  Patient Active Problem List   Diagnosis Date Noted   Miscarriage 05/24/2019    History reviewed. No pertinent surgical history.  OB History     Gravida  1   Para      Term      Preterm      AB      Living         SAB      IAB      Ectopic      Multiple      Live Births               Home Medications    Prior to Admission medications   Medication Sig Start Date End Date Taking? Authorizing Provider  Multiple Vitamins-Minerals (MULTIVITAMIN) tablet Take 1 tablet by mouth daily. 05/24/19   Calvert Cantor, CNM  promethazine-dextromethorphan (PROMETHAZINE-DM) 6.25-15 MG/5ML syrup Take 5 mLs by mouth 2 (two) times daily as needed for cough. 11/02/21   Raspet, Noberto Retort, PA-C  promethazine (PHENERGAN) 25 MG tablet Take 1 tablet (25 mg total) by mouth every 6 (six) hours as needed for nausea or vomiting. 05/24/19 12/23/19  Calvert Cantor, CNM    Family History History reviewed. No pertinent family history.  Social History Social History   Tobacco Use   Smoking status: Never   Smokeless tobacco: Never  Substance Use Topics   Alcohol use: Yes    Comment: occ   Drug use: Never     Allergies   Amoxicillin   Review  of Systems Review of Systems  Constitutional:  Negative for chills and fever.  HENT:  Positive for sore throat.   Eyes:  Negative for pain and redness.  Respiratory:  Negative for shortness of breath.   Cardiovascular:  Negative for chest pain.  Gastrointestinal:  Negative for abdominal pain, diarrhea, nausea and vomiting.  Genitourinary:  Negative for decreased urine volume, difficulty urinating, dysuria, flank pain, frequency, genital sores, hematuria and urgency.  Musculoskeletal:  Negative for back pain.  Skin:  Negative for rash.  All other systems reviewed and are negative.    Physical Exam Triage Vital Signs ED Triage Vitals  Enc Vitals Group     BP 03/27/22 1206 117/71     Pulse Rate 03/27/22 1206 76     Resp 03/27/22 1206 18     Temp 03/27/22 1206 98.2 F (36.8 C)     Temp src --      SpO2 03/27/22 1206 98 %     Weight --      Height --      Head Circumference --      Peak Flow --      Pain Score 03/27/22 1159 0     Pain Loc --  Pain Edu? --      Excl. in GC? --    No data found.  Updated Vital Signs BP 117/71   Pulse 76   Temp 98.2 F (36.8 C)   Resp 18   LMP 03/15/2022   SpO2 98%   Visual Acuity Right Eye Distance:   Left Eye Distance:   Bilateral Distance:    Right Eye Near:   Left Eye Near:    Bilateral Near:     Physical Exam Vitals reviewed.  Constitutional:      Appearance: Normal appearance. She is not ill-appearing.  HENT:     Head: Normocephalic and atraumatic.     Right Ear: Hearing, tympanic membrane, ear canal and external ear normal. No swelling or tenderness. No middle ear effusion. There is no impacted cerumen. No mastoid tenderness. Tympanic membrane is not injected, scarred, perforated, erythematous, retracted or bulging.     Left Ear: Hearing, tympanic membrane, ear canal and external ear normal. No swelling or tenderness.  No middle ear effusion. There is no impacted cerumen. No mastoid tenderness. Tympanic membrane is not  injected, scarred, perforated, erythematous, retracted or bulging.     Mouth/Throat:     Pharynx: Oropharynx is clear. No oropharyngeal exudate or posterior oropharyngeal erythema.     Tonsils: No tonsillar exudate. 2+ on the right. 2+ on the left.     Comments: Tonsils are 2+ bilaterally, nonerythematous, no exudate. On exam, uvula is midline, she is tolerating her secretions without difficulty, there is no trismus, no drooling, she has normal phonation  Cardiovascular:     Rate and Rhythm: Normal rate and regular rhythm.     Heart sounds: Normal heart sounds.  Pulmonary:     Effort: Pulmonary effort is normal.     Breath sounds: Normal breath sounds.  Abdominal:     Tenderness: There is no abdominal tenderness.  Lymphadenopathy:     Cervical: No cervical adenopathy.  Neurological:     General: No focal deficit present.     Mental Status: She is alert and oriented to person, place, and time.  Psychiatric:        Mood and Affect: Mood normal.        Behavior: Behavior normal.        Thought Content: Thought content normal.        Judgment: Judgment normal.      UC Treatments / Results  Labs (all labs ordered are listed, but only abnormal results are displayed) Labs Reviewed  RPR  HIV ANTIBODY (ROUTINE TESTING W REFLEX)  POC URINE PREG, ED  CERVICOVAGINAL ANCILLARY ONLY  CERVICOVAGINAL ANCILLARY ONLY    EKG   Radiology No results found.  Procedures Procedures (including critical care time)  Medications Ordered in UC Medications - No data to display  Initial Impression / Assessment and Plan / UC Course  I have reviewed the triage vital signs and the nursing notes.  Pertinent labs & imaging results that were available during my care of the patient were reviewed by me and considered in my medical decision making (see chart for details).     This patient is a very pleasant 26 y.o. year old female presenting for STI screen. Afebrile, nontachycardic, no reproducible  abd pain or CVAT. Tonsils are 2+ bilaterally but nonerythematous, no exudate; suspect tonsillar hypertrophy at baseline.   U-preg negative. Will send self-swab for G/C, trich, yeast, BV testing. Also sent HIV, RPR. Safe sex precautions.  I collected a swab for pharyngeal gonorrhea,  chlamydia.  Will wait to treat.  ED return precautions discussed. Patient verbalizes understanding and agreement.    Final Clinical Impressions(s) / UC Diagnoses   Final diagnoses:  Routine screening for STI (sexually transmitted infection)     Discharge Instructions      -We have sent testing for sexually transmitted infections. We will notify you of any positive results once they are received. If required, we will prescribe any medications you might need. Please refrain from all sexual activity until treatment is complete.  -Seek additional medical attention if you develop fevers/chills, new/worsening abdominal pain, new/worsening vaginal discomfort/discharge, etc.     ED Prescriptions   None    PDMP not reviewed this encounter.   Rhys Martini, PA-C 03/27/22 1221

## 2022-03-27 NOTE — ED Triage Notes (Signed)
Pt is present today for STD testing.

## 2022-03-27 NOTE — Discharge Instructions (Addendum)
-  We have sent testing for sexually transmitted infections. We will notify you of any positive results once they are received. If required, we will prescribe any medications you might need. Please refrain from all sexual activity until treatment is complete.  -Seek additional medical attention if you develop fevers/chills, new/worsening abdominal pain, new/worsening vaginal discomfort/discharge, etc.   

## 2022-03-28 LAB — CERVICOVAGINAL ANCILLARY ONLY
Bacterial Vaginitis (gardnerella): NEGATIVE
Candida Glabrata: NEGATIVE
Candida Vaginitis: NEGATIVE
Chlamydia: NEGATIVE
Chlamydia: NEGATIVE
Comment: NEGATIVE
Comment: NEGATIVE
Comment: NEGATIVE
Comment: NEGATIVE
Comment: NEGATIVE
Comment: NORMAL
Comment: NEGATIVE
Comment: NEGATIVE
Comment: NORMAL
Neisseria Gonorrhea: NEGATIVE
Neisseria Gonorrhea: NEGATIVE
Trichomonas: NEGATIVE
Trichomonas: NEGATIVE

## 2022-03-28 LAB — RPR: RPR Ser Ql: NONREACTIVE

## 2022-04-10 ENCOUNTER — Ambulatory Visit: Payer: BC Managed Care – PPO

## 2022-09-08 ENCOUNTER — Ambulatory Visit (HOSPITAL_COMMUNITY)
Admission: EM | Admit: 2022-09-08 | Discharge: 2022-09-08 | Disposition: A | Discharge status: Home / Self Care | Payer: BC Managed Care – PPO | Attending: Emergency Medicine | Admitting: Emergency Medicine

## 2022-09-08 ENCOUNTER — Encounter (HOSPITAL_COMMUNITY): Payer: Self-pay

## 2022-09-08 DIAGNOSIS — Z1152 Encounter for screening for COVID-19: Secondary | ICD-10-CM | POA: Insufficient documentation

## 2022-09-08 DIAGNOSIS — H6501 Acute serous otitis media, right ear: Secondary | ICD-10-CM | POA: Diagnosis present

## 2022-09-08 DIAGNOSIS — R6889 Other general symptoms and signs: Secondary | ICD-10-CM | POA: Diagnosis not present

## 2022-09-08 LAB — POC INFLUENZA A AND B ANTIGEN (URGENT CARE ONLY)
INFLUENZA A ANTIGEN, POC: NEGATIVE
INFLUENZA B ANTIGEN, POC: NEGATIVE

## 2022-09-08 LAB — SARS CORONAVIRUS 2 (TAT 6-24 HRS): SARS Coronavirus 2: NEGATIVE

## 2022-09-08 MED ORDER — CEFDINIR 300 MG PO CAPS: 300.0000 mg | ORAL_CAPSULE | Freq: Two times a day (BID) | ORAL | 0 refills | Status: AC

## 2022-09-08 NOTE — Discharge Instructions (Signed)
Today you are being treated for an infection of the eardrum  Take cefdenir twice daily for 10 days, you should begin to see improvement after 48 hours of medication use and then it should progressively get better  You may use Tylenol or ibuprofen for management of discomfort  May hold warm compresses to the ear for additional comfort  Please not attempted any ear cleaning or object or fluid placement into the ear canal to prevent further irritation  Flu test is pending, you will be notified of positive test results only, if positive he may receive Tamiflu which is the antiviral which will help to minimize symptoms  COVID test is pending, you will be notified of positive test results only, if positive you will need to quarantine for 5 days   You can take Tylenol and/or Ibuprofen as needed for fever reduction and pain relief.   For cough: honey 1/2 to 1 teaspoon (you can dilute the honey in water or another fluid).  You can also use guaifenesin and dextromethorphan for cough. You can use a humidifier for chest congestion and cough.  If you don't have a humidifier, you can sit in the bathroom with the hot shower running.      For sore throat: try warm salt water gargles, cepacol lozenges, throat spray, warm tea or water with lemon/honey, popsicles or ice, or OTC cold relief medicine for throat discomfort.   For congestion: take a daily anti-histamine like Zyrtec, Claritin, and a oral decongestant, such as pseudoephedrine.  You can also use Flonase 1-2 sprays in each nostril daily.   It is important to stay hydrated: drink plenty of fluids (water, gatorade/powerade/pedialyte, juices, or teas) to keep your throat moisturized and help further relieve irritation/discomfort.

## 2022-09-08 NOTE — ED Triage Notes (Signed)
Chief Complaint: right ear pain, nasal congestion, fever. Patient requested Covid and flu testing. Patient having chills, no body aches.   Onset: Last night   Prescriptions or OTC medications tried: No    Sick exposure: No  New foods, medications, or products: No  Recent Travel: No

## 2022-09-08 NOTE — ED Provider Notes (Signed)
[facility]    CSN: [id number] Arrival date & time: 09/08/22  1101      History   Chief Complaint Chief Complaint  Patient presents with   Ear Pain   Nasal Congestion   Fever    HPI Cynthia Reeves is a 26 y.o. female.   Patient presents with fever, chills, nasal congestion, right-sided ear pain, rhinorrhea, sore throat and nonproductive cough beginning overnight.  Fever peaking at 101.  Decreased appetite, tolerating fluids.  No known sick contacts.  Denies shortness of breath or wheezing.  Denies respiratory history.  History reviewed. No pertinent past medical history.  Patient Active Problem List   Diagnosis Date Noted   Miscarriage 05/24/2019    History reviewed. No pertinent surgical history.  OB History     Gravida  1   Para      Term      Preterm      AB      Living         SAB      IAB      Ectopic      Multiple      Live Births               Home Medications    Prior to Admission medications   Medication Sig Start Date End Date Taking? Authorizing Provider  Multiple Vitamins-Minerals (MULTIVITAMIN) tablet Take 1 tablet by mouth daily. 05/24/19   Calvert Cantor, CNM  promethazine-dextromethorphan (PROMETHAZINE-DM) 6.25-15 MG/5ML syrup Take 5 mLs by mouth 2 (two) times daily as needed for cough. 11/02/21   Raspet, Noberto Retort, PA-C  promethazine (PHENERGAN) 25 MG tablet Take 1 tablet (25 mg total) by mouth every 6 (six) hours as needed for nausea or vomiting. 05/24/19 12/23/19  Calvert Cantor, CNM    Family History History reviewed. No pertinent family history.  Social History Social History   Tobacco Use   Smoking status: Never   Smokeless tobacco: Never  Vaping Use   Vaping Use: Some days  Substance Use Topics   Alcohol use: Yes    Comment: occ   Drug use: Never     Allergies   Amoxicillin   Review of Systems Review of Systems  Constitutional:  Positive for chills and fever. Negative for activity  change, appetite change, diaphoresis, fatigue and unexpected weight change.  HENT:  Positive for congestion, ear pain, rhinorrhea and sore throat. Negative for dental problem, drooling, ear discharge, facial swelling, hearing loss, mouth sores, nosebleeds, postnasal drip, sinus pressure, sinus pain, sneezing, tinnitus, trouble swallowing and voice change.   Respiratory:  Positive for cough. Negative for apnea, choking, chest tightness, shortness of breath, wheezing and stridor.   Cardiovascular: Negative.   Gastrointestinal: Negative.   Musculoskeletal: Negative.   Skin: Negative.      Physical Exam Triage Vital Signs ED Triage Vitals  Enc Vitals Group     BP 09/08/22 1254 119/70     Pulse Rate 09/08/22 1254 64     Resp 09/08/22 1254 16     Temp 09/08/22 1254 97.9 F (36.6 C)     Temp Source 09/08/22 1254 Oral     SpO2 09/08/22 1254 98 %     Weight 09/08/22 1256 200 lb (90.7 kg)     Height 09/08/22 1256 5\' 7"  (1.702 m)     Head Circumference --      Peak Flow --      Pain Score 09/08/22 1253 10  Pain Loc --      Pain Edu? --      Excl. in GC? --    No data found.  Updated Vital Signs BP 119/70 (BP Location: Left Arm)   Pulse 64   Temp 97.9 F (36.6 C) (Oral)   Resp 16   Ht 5\' 7"  (1.702 m)   Wt 200 lb (90.7 kg)   LMP 08/19/2022 (Exact Date)   SpO2 98%   BMI 31.32 kg/m   Visual Acuity Right Eye Distance:   Left Eye Distance:   Bilateral Distance:    Right Eye Near:   Left Eye Near:    Bilateral Near:     Physical Exam Constitutional:      Appearance: Normal appearance.  HENT:     Right Ear: Hearing, ear canal and external ear normal. Tympanic membrane is erythematous.     Left Ear: Hearing, tympanic membrane, ear canal and external ear normal.     Nose: Congestion and rhinorrhea present.     Mouth/Throat:     Mouth: Mucous membranes are moist.     Pharynx: No posterior oropharyngeal erythema.  Eyes:     Extraocular Movements: Extraocular movements  intact.  Cardiovascular:     Rate and Rhythm: Normal rate and regular rhythm.     Pulses: Normal pulses.     Heart sounds: Normal heart sounds.  Pulmonary:     Effort: Pulmonary effort is normal.     Breath sounds: Normal breath sounds.  Neurological:     Mental Status: She is alert and oriented to person, place, and time.      UC Treatments / Results  Labs (all labs ordered are listed, but only abnormal results are displayed) Labs Reviewed - No data to display  EKG   Radiology No results found.  Procedures Procedures (including critical care time)  Medications Ordered in UC Medications - No data to display  Initial Impression / Assessment and Plan / UC Course  I have reviewed the triage vital signs and the nursing notes.  Pertinent labs & imaging results that were available during my care of the patient were reviewed by me and considered in my medical decision making (see chart for details).  Nonrecurrent acute serous otitis media of the right ear  Erythema is noted to the right tympanic membrane, no drainage or swelling present within the canal, congestion is present to the bilateral nasal turbinates, otherwise stable exam, vital signs are stable patient is in no signs of distress, flu and COVID test pending, will prescribe Tamiflu if positive, endorses intolerance to amoxicillin due to yeast infections therefore cefdinir prescribed, may use additional over-the-counter medications as needed, may follow-up with his urgent care as needed, work note given Final Clinical Impressions(s) / UC Diagnoses   Final diagnoses:  None   Discharge Instructions   None    ED Prescriptions   None    PDMP not reviewed this encounter.   14/11/2021, NP 09/08/22 1402

## 2022-10-10 ENCOUNTER — Ambulatory Visit: Payer: BC Managed Care – PPO

## 2022-11-22 ENCOUNTER — Ambulatory Visit (HOSPITAL_COMMUNITY)
Admission: EM | Admit: 2022-11-22 | Discharge: 2022-11-22 | Disposition: A | Discharge status: Home / Self Care | Payer: BC Managed Care – PPO | Attending: Internal Medicine | Admitting: Internal Medicine

## 2022-11-22 ENCOUNTER — Other Ambulatory Visit: Payer: Self-pay

## 2022-11-22 ENCOUNTER — Encounter (HOSPITAL_COMMUNITY): Payer: Self-pay | Admitting: Emergency Medicine

## 2022-11-22 DIAGNOSIS — Z202 Contact with and (suspected) exposure to infections with a predominantly sexual mode of transmission: Secondary | ICD-10-CM | POA: Diagnosis present

## 2022-11-22 DIAGNOSIS — Z3202 Encounter for pregnancy test, result negative: Secondary | ICD-10-CM | POA: Diagnosis not present

## 2022-11-22 DIAGNOSIS — Z789 Other specified health status: Secondary | ICD-10-CM

## 2022-11-22 DIAGNOSIS — A5602 Chlamydial vulvovaginitis: Secondary | ICD-10-CM | POA: Insufficient documentation

## 2022-11-22 LAB — POC URINE PREG, ED: Preg Test, Ur: NEGATIVE

## 2022-11-22 LAB — HIV ANTIBODY (ROUTINE TESTING W REFLEX): HIV Screen 4th Generation wRfx: NONREACTIVE

## 2022-11-22 NOTE — Discharge Instructions (Addendum)
Your STD testing has been sent to the lab and will come back in the next 2 to 3 days.  We will call you if any of your results are positive requiring treatment and treat you at that time.   If you do not receive a phone call from [place], this means your testing was negative.  Avoid sexual intercourse until your STD results come back.  If any of your STD results are positive, you will need to avoid sexual intercourse for 7 days while you are being treated to prevent spread of STD.  Condom use is the best way to prevent spread of STDs.  Return to urgent care as needed.

## 2022-11-22 NOTE — ED Provider Notes (Signed)
[facility]    CSN: [id number] Arrival date & time: 11/22/22  1643      History   Chief Complaint Chief Complaint  Patient presents with   Exposure to STD    HPI Cynthia Reeves is a 27 y.o. female.   Patient presents to urgent care for evaluation and routine STD screening after she recently participated in unprotected sexual intercourse with a new female partner.  She is not having any vaginal, urinary, or gastrointestinal symptoms.  No fever/chills.  No history of HSV-2.  No vaginal rash.  She would like a pregnancy test today.  She states that she took a Plan B pill before participating in sexual intercourse and is wondering if this will prevent pregnancy.  She does not use any forms of contraception and they did not use a condom.  No history of HIV or syphilis reported. LMP 11/04/2022.   Exposure to STD    History reviewed. No pertinent past medical history.  Patient Active Problem List   Diagnosis Date Noted   Miscarriage 05/24/2019    History reviewed. No pertinent surgical history.  OB History     Gravida  1   Para      Term      Preterm      AB      Living         SAB      IAB      Ectopic      Multiple      Live Births               Home Medications    Prior to Admission medications   Medication Sig Start Date End Date Taking? Authorizing Provider  Multiple Vitamins-Minerals (MULTIVITAMIN) tablet Take 1 tablet by mouth daily. Patient not taking: Reported on 11/22/2022 05/24/19   Darlina Rumpf, CNM  promethazine-dextromethorphan (PROMETHAZINE-DM) 6.25-15 MG/5ML syrup Take 5 mLs by mouth 2 (two) times daily as needed for cough. Patient not taking: Reported on 11/22/2022 11/02/21   Raspet, Derry Skill, PA-C  promethazine (PHENERGAN) 25 MG tablet Take 1 tablet (25 mg total) by mouth every 6 (six) hours as needed for nausea or vomiting. 05/24/19 12/23/19  Darlina Rumpf, CNM    Family History History reviewed. No pertinent family  history.  Social History Social History   Tobacco Use   Smoking status: Never   Smokeless tobacco: Never  Vaping Use   Vaping Use: Some days  Substance Use Topics   Alcohol use: Yes    Comment: occ   Drug use: Never     Allergies   Amoxicillin   Review of Systems Review of Systems Per HPI  Physical Exam Triage Vital Signs ED Triage Vitals  Enc Vitals Group     BP 11/22/22 1756 122/88     Pulse Rate 11/22/22 1756 75     Resp 11/22/22 1756 20     Temp 11/22/22 1756 98.5 F (36.9 C)     Temp Source 11/22/22 1756 Oral     SpO2 11/22/22 1756 99 %     Weight --      Height --      Head Circumference --      Peak Flow --      Pain Score 11/22/22 1754 0     Pain Loc --      Pain Edu? --      Excl. in [place]? --    No data found.  Updated Vital Signs  BP 122/88 (BP Location: Right Arm) Comment (BP Location): large cuff  Pulse 75   Temp 98.5 F (36.9 C) (Oral)   Resp 20   LMP 11/04/2022   SpO2 99%   Visual Acuity Right Eye Distance:   Left Eye Distance:   Bilateral Distance:    Right Eye Near:   Left Eye Near:    Bilateral Near:     Physical Exam Vitals and nursing note reviewed.  Constitutional:      Appearance: She is not ill-appearing or toxic-appearing.  HENT:     Head: Normocephalic and atraumatic.     Right Ear: Hearing and external ear normal.     Left Ear: Hearing and external ear normal.     Nose: Nose normal.     Mouth/Throat:     Lips: Pink.     Mouth: Mucous membranes are moist.     Pharynx: No posterior oropharyngeal erythema.  Eyes:     General: Lids are normal. Vision grossly intact. Gaze aligned appropriately.     Extraocular Movements: Extraocular movements intact.     Conjunctiva/sclera: Conjunctivae normal.  Cardiovascular:     Rate and Rhythm: Normal rate and regular rhythm.     Heart sounds: Normal heart sounds, S1 normal and S2 normal.  Pulmonary:     Effort: Pulmonary effort is normal. No respiratory distress.     Breath  sounds: Normal breath sounds and air entry.  Musculoskeletal:     Cervical back: Neck supple.  Skin:    General: Skin is warm and dry.     Capillary Refill: Capillary refill takes less than 2 seconds.     Findings: No rash.  Neurological:     General: No focal deficit present.     Mental Status: She is alert and oriented to person, place, and time. Mental status is at baseline.     Cranial Nerves: No dysarthria or facial asymmetry.  Psychiatric:        Mood and Affect: Mood normal.        Speech: Speech normal.        Behavior: Behavior normal.        Thought Content: Thought content normal.        Judgment: Judgment normal.      UC Treatments / Results  Labs (all labs ordered are listed, but only abnormal results are displayed) Labs Reviewed  HIV ANTIBODY (ROUTINE TESTING W REFLEX)  RPR  POC URINE PREG, ED  CERVICOVAGINAL ANCILLARY ONLY    EKG   Radiology No results found.  Procedures Procedures (including critical care time)  Medications Ordered in UC Medications - No data to display  Initial Impression / Assessment and Plan / UC Course  I have reviewed the triage vital signs and the nursing notes.  Pertinent labs & imaging results that were available during my care of the patient were reviewed by me and considered in my medical decision making (see chart for details).   1.  Possible exposure to STD, negative pregnancy test, uses emergency contraception STI labs pending.  Patient requests HIV and syphilis testing today.  Will notify patient of positive results and treat accordingly when labs come back.  Patient to avoid sexual intercourse until screening testing comes back.  Education provided regarding safe sexual practices and patient encouraged to use protection to prevent spread of STIs.   Urine pregnancy is negative.  Discussed physical exam and available lab work findings in clinic with patient.  Counseled patient regarding appropriate  use of medications  and potential side effects for all medications recommended or prescribed today. Discussed red flag signs and symptoms of worsening condition,when to call the PCP office, return to urgent care, and when to seek higher level of care in the emergency department. Patient verbalizes understanding and agreement with plan. All questions answered. Patient discharged in stable condition.   Final Clinical Impressions(s) / UC Diagnoses   Final diagnoses:  Possible exposure to STD  Negative pregnancy test  Uses emergency contraception     Discharge Instructions      Your STD testing has been sent to the lab and will come back in the next 2 to 3 days.  We will call you if any of your results are positive requiring treatment and treat you at that time.   If you do not receive a phone call from [place], this means your testing was negative.  Avoid sexual intercourse until your STD results come back.  If any of your STD results are positive, you will need to avoid sexual intercourse for 7 days while you are being treated to prevent spread of STD.  Condom use is the best way to prevent spread of STDs.  Return to urgent care as needed.      ED Prescriptions   None    PDMP not reviewed this encounter.   Talbot Grumbling, [place] 11/22/22 1818

## 2022-11-22 NOTE — ED Triage Notes (Signed)
LMP February 18.  Patient requesting a pregnancy test.  Patient is not on any BC.    Denies any std symptoms.  Patient has a new partner

## 2022-11-23 LAB — CERVICOVAGINAL ANCILLARY ONLY
Bacterial Vaginitis (gardnerella): NEGATIVE
Candida Glabrata: NEGATIVE
Candida Vaginitis: NEGATIVE
Chlamydia: POSITIVE — AB
Comment: NEGATIVE
Comment: NEGATIVE
Comment: NEGATIVE
Comment: NEGATIVE
Comment: NEGATIVE
Comment: NORMAL
Neisseria Gonorrhea: NEGATIVE
Trichomonas: NEGATIVE

## 2022-11-23 LAB — RPR: RPR Ser Ql: NONREACTIVE

## 2022-11-25 ENCOUNTER — Telehealth: Payer: Self-pay | Admitting: Emergency Medicine

## 2022-11-25 MED ORDER — DOXYCYCLINE HYCLATE 100 MG PO CAPS: 100.0000 mg | ORAL_CAPSULE | Freq: Two times a day (BID) | ORAL | 0 refills | Status: AC

## 2022-11-26 ENCOUNTER — Telehealth (HOSPITAL_COMMUNITY): Payer: Self-pay | Admitting: Emergency Medicine

## 2022-11-26 NOTE — Telephone Encounter (Signed)
Patient returned follow up call from recent visit.  All questions answered, patient wa sable to pick up medicine.

## 2023-01-08 ENCOUNTER — Encounter (HOSPITAL_COMMUNITY): Payer: Self-pay

## 2023-01-08 ENCOUNTER — Ambulatory Visit (HOSPITAL_COMMUNITY)
Admission: RE | Admit: 2023-01-08 | Discharge: 2023-01-08 | Disposition: A | Discharge status: Home / Self Care | Payer: BC Managed Care – PPO | Source: Ambulatory Visit | Attending: Internal Medicine | Admitting: Internal Medicine

## 2023-01-08 VITALS — BP 117/71 | HR 87 | Temp 98.5°F | Resp 16

## 2023-01-08 DIAGNOSIS — Z202 Contact with and (suspected) exposure to infections with a predominantly sexual mode of transmission: Secondary | ICD-10-CM

## 2023-01-08 NOTE — ED Triage Notes (Signed)
Pt states she would like to be tested for STD's.  Denies having any symptoms.

## 2023-01-08 NOTE — Discharge Instructions (Addendum)

## 2023-01-09 LAB — CERVICOVAGINAL ANCILLARY ONLY
Bacterial Vaginitis (gardnerella): NEGATIVE
Candida Glabrata: NEGATIVE
Candida Vaginitis: NEGATIVE
Chlamydia: NEGATIVE
Comment: NEGATIVE
Comment: NEGATIVE
Comment: NEGATIVE
Comment: NEGATIVE
Comment: NEGATIVE
Comment: NORMAL
Neisseria Gonorrhea: NEGATIVE
Trichomonas: NEGATIVE

## 2023-01-10 NOTE — ED Provider Notes (Signed)
[facility]    CSN: [id number] Arrival date & time: 01/08/23  1841      History   Chief Complaint Chief Complaint  Patient presents with   Exposure to STD    Entered by patient    HPI Cynthia Reeves is a 27 y.o. female.   Patient presents to urgent care for routine STD screening. No symptoms or recent new sexual partners. She is sexually active without condoms with female partner. No known exposure to STD. Denies urinary symptoms. No N/V/D, abdominal pain, vaginal rash, vaginal symptoms, flank pain, low back pain, or fever/chills.    Exposure to STD    History reviewed. No pertinent past medical history.  Patient Active Problem List   Diagnosis Date Noted   Miscarriage 05/24/2019    History reviewed. No pertinent surgical history.  OB History     Gravida  1   Para      Term      Preterm      AB      Living         SAB      IAB      Ectopic      Multiple      Live Births               Home Medications    Prior to Admission medications   Medication Sig Start Date End Date Taking? Authorizing Provider  Multiple Vitamins-Minerals (MULTIVITAMIN) tablet Take 1 tablet by mouth daily. Patient not taking: Reported on 11/22/2022 05/24/19   Calvert Cantor, CNM  promethazine-dextromethorphan (PROMETHAZINE-DM) 6.25-15 MG/5ML syrup Take 5 mLs by mouth 2 (two) times daily as needed for cough. Patient not taking: Reported on 11/22/2022 11/02/21   Raspet, Noberto Retort, PA-C  promethazine (PHENERGAN) 25 MG tablet Take 1 tablet (25 mg total) by mouth every 6 (six) hours as needed for nausea or vomiting. 05/24/19 12/23/19  Calvert Cantor, CNM    Family History History reviewed. No pertinent family history.  Social History Social History   Tobacco Use   Smoking status: Never   Smokeless tobacco: Never  Vaping Use   Vaping Use: Some days  Substance Use Topics   Alcohol use: Yes    Comment: occ   Drug use: Never     Allergies    Amoxicillin   Review of Systems Review of Systems Per HPI  Physical Exam Triage Vital Signs ED Triage Vitals  Enc Vitals Group     BP 01/08/23 1850 117/71     Pulse Rate 01/08/23 1849 87     Resp 01/08/23 1849 16     Temp 01/08/23 1849 98.5 F (36.9 C)     Temp src --      SpO2 01/08/23 1849 96 %     Weight --      Height --      Head Circumference --      Peak Flow --      Pain Score 01/08/23 1850 0     Pain Loc --      Pain Edu? --      Excl. in [place]? --    No data found.  Updated Vital Signs BP 117/71 (BP Location: Left Arm)   Pulse 87   Temp 98.5 F (36.9 C)   Resp 16   LMP 12/23/2022 (Exact Date)   SpO2 96%   Visual Acuity Right Eye Distance:   Left Eye Distance:   Bilateral Distance:  Right Eye Near:   Left Eye Near:    Bilateral Near:     Physical Exam Vitals and nursing note reviewed.  Constitutional:      Appearance: She is not ill-appearing or toxic-appearing.  HENT:     Head: Normocephalic and atraumatic.     Right Ear: Hearing and external ear normal.     Left Ear: Hearing and external ear normal.     Nose: Nose normal.     Mouth/Throat:     Lips: Pink.  Eyes:     General: Lids are normal. Vision grossly intact. Gaze aligned appropriately.     Extraocular Movements: Extraocular movements intact.     Conjunctiva/sclera: Conjunctivae normal.  Pulmonary:     Effort: Pulmonary effort is normal.  Genitourinary:    Comments: Deferred. Musculoskeletal:     Cervical back: Neck supple.  Skin:    General: Skin is warm and dry.     Capillary Refill: Capillary refill takes less than 2 seconds.     Findings: No rash.  Neurological:     General: No focal deficit present.     Mental Status: She is alert and oriented to person, place, and time. Mental status is at baseline.     Cranial Nerves: No dysarthria or facial asymmetry.  Psychiatric:        Mood and Affect: Mood normal.        Speech: Speech normal.        Behavior: Behavior normal.         Thought Content: Thought content normal.        Judgment: Judgment normal.      UC Treatments / Results  Labs (all labs ordered are listed, but only abnormal results are displayed) Labs Reviewed  CERVICOVAGINAL ANCILLARY ONLY    EKG   Radiology No results found.  Procedures Procedures (including critical care time)  Medications Ordered in UC Medications - No data to display  Initial Impression / Assessment and Plan / UC Course  I have reviewed the triage vital signs and the nursing notes.  Pertinent labs & imaging results that were available during my care of the patient were reviewed by me and considered in my medical decision making (see chart for details).   1. Possible exposure to STD STI labs pending.  Patient declines HIV and syphilis testing today as this was performed at last visit 1 month ago.  Will notify patient of positive results and treat accordingly when labs come back.  Patient to avoid sexual intercourse until screening testing comes back.  Education provided regarding safe sexual practices and patient encouraged to use protection to prevent spread of STIs.   Discussed physical exam and available lab work findings in clinic with patient.  Counseled patient regarding appropriate use of medications and potential side effects for all medications recommended or prescribed today. Discussed red flag signs and symptoms of worsening condition,when to call the PCP office, return to urgent care, and when to seek higher level of care in the emergency department. Patient verbalizes understanding and agreement with plan. All questions answered. Patient discharged in stable condition.    Final Clinical Impressions(s) / UC Diagnoses   Final diagnoses:  Possible exposure to STD     Discharge Instructions      Your STD testing has been sent to the lab and will come back in the next 2 to 3 days.  We will call you if any of your results are positive requiring  treatment and  treat you at that time.   If you do not receive a phone call from [place], this means your testing was negative.  Avoid sexual intercourse until your STD results come back.  If any of your STD results are positive, you will need to avoid sexual intercourse for 7 days while you are being treated to prevent spread of STD.  Condom use is the best way to prevent spread of STDs.  Return to urgent care as needed.    ED Prescriptions   None    PDMP not reviewed this encounter.   Carlisle Beers, [place] 01/10/23 2219

## 2023-01-29 ENCOUNTER — Ambulatory Visit
Admission: RE | Admit: 2023-01-29 | Discharge: 2023-01-29 | Disposition: A | Discharge status: Home / Self Care | Payer: Self-pay | Source: Ambulatory Visit | Attending: Emergency Medicine | Admitting: Emergency Medicine

## 2023-01-29 VITALS — BP 104/72 | HR 75 | Temp 98.1°F | Resp 18

## 2023-01-29 DIAGNOSIS — Z202 Contact with and (suspected) exposure to infections with a predominantly sexual mode of transmission: Secondary | ICD-10-CM | POA: Insufficient documentation

## 2023-01-29 NOTE — ED Provider Notes (Signed)
[facility]    CSN: [id number] Arrival date & time: 01/29/23  1347      History   Chief Complaint Chief Complaint  Patient presents with   Exposure to STD    Entered by patient    HPI Cynthia Reeves is a 27 y.o. female. She reports her boyfriend told her he has chlamydia and advised her to get treated. She reports vaginal odor and low B abd pain for the last 2 weeks. Denies dysuria, hematuria, flank pain, N/V, or vaginal discharge. Denies fever. LMP 01/21/23   Exposure to STD    History reviewed. No pertinent past medical history.  Patient Active Problem List   Diagnosis Date Noted   Miscarriage 05/24/2019    History reviewed. No pertinent surgical history.  OB History     Gravida  1   Para      Term      Preterm      AB      Living         SAB      IAB      Ectopic      Multiple      Live Births               Home Medications    Prior to Admission medications   Medication Sig Start Date End Date Taking? Authorizing Provider  promethazine (PHENERGAN) 25 MG tablet Take 1 tablet (25 mg total) by mouth every 6 (six) hours as needed for nausea or vomiting. 05/24/19 12/23/19  Calvert Cantor, CNM    Family History No family history on file.  Social History Social History   Tobacco Use   Smoking status: Never   Smokeless tobacco: Never  Vaping Use   Vaping Use: Some days  Substance Use Topics   Alcohol use: Yes    Comment: occ   Drug use: Never     Allergies   Amoxicillin   Review of Systems Review of Systems   Physical Exam Triage Vital Signs ED Triage Vitals  Enc Vitals Group     BP 01/29/23 1428 104/72     Pulse Rate 01/29/23 1428 75     Resp 01/29/23 1428 18     Temp 01/29/23 1428 98.1 F (36.7 C)     Temp Source 01/29/23 1428 Oral     SpO2 01/29/23 1428 100 %     Weight --      Height --      Head Circumference --      Peak Flow --      Pain Score 01/29/23 1429 0     Pain Loc --      Pain  Edu? --      Excl. in [place]? --    No data found.  Updated Vital Signs BP 104/72 (BP Location: Left Arm)   Pulse 75   Temp 98.1 F (36.7 C) (Oral)   Resp 18   LMP 01/21/2023 (Exact Date)   SpO2 100%   Visual Acuity Right Eye Distance:   Left Eye Distance:   Bilateral Distance:    Right Eye Near:   Left Eye Near:    Bilateral Near:     Physical Exam Constitutional:      Appearance: Normal appearance.  Pulmonary:     Effort: Pulmonary effort is normal.  Abdominal:     General: Abdomen is flat. Bowel sounds are normal.     Palpations: Abdomen is soft.  Tenderness: There is no abdominal tenderness. There is no right CVA tenderness, left CVA tenderness, guarding or rebound.  Neurological:     Mental Status: She is alert.      UC Treatments / Results  Labs (all labs ordered are listed, but only abnormal results are displayed) Labs Reviewed  RPR  HIV ANTIBODY (ROUTINE TESTING W REFLEX)  CERVICOVAGINAL ANCILLARY ONLY    EKG   Radiology No results found.  Procedures Procedures (including critical care time)  Medications Ordered in UC Medications - No data to display  Initial Impression / Assessment and Plan / UC Course  I have reviewed the triage vital signs and the nursing notes.  Pertinent labs & imaging results that were available during my care of the patient were reviewed by me and considered in my medical decision making (see chart for details).     Vaginal swab obtained. HIV and RPR tests obtained. Will empirically tx for chlamydia. Pt understands she will get a call if other tests positive. If her vaginal and low abd symptoms persist after treatment, she will seek further care.   Final Clinical Impressions(s) / UC Diagnoses   Final diagnoses:  STD exposure     Discharge Instructions      Take all of the antibiotics as prescribed, even if you are feeling better. If you are still having symptoms in a week, please get checked again.   You  will get a call if tests are positive, you will not get a call if tests are negative but you can check results in MyChart if you have a MyChart account.     ED Prescriptions   None    PDMP not reviewed this encounter.   Cathlyn Parsons, NP 01/29/23 [phone]

## 2023-01-29 NOTE — Discharge Instructions (Signed)
Take all of the antibiotics as prescribed, even if you are feeling better. If you are still having symptoms in a week, please get checked again.   You will get a call if tests are positive, you will not get a call if tests are negative but you can check results in MyChart if you have a MyChart account.
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
## 2023-01-30 ENCOUNTER — Telehealth (HOSPITAL_COMMUNITY): Payer: Self-pay | Admitting: Emergency Medicine

## 2023-01-30 LAB — CERVICOVAGINAL ANCILLARY ONLY
Bacterial Vaginitis (gardnerella): NEGATIVE
Candida Glabrata: NEGATIVE
Candida Vaginitis: POSITIVE — AB
Chlamydia: NEGATIVE
Comment: NEGATIVE
Comment: NEGATIVE
Comment: NEGATIVE
Comment: NEGATIVE
Comment: NEGATIVE
Comment: NORMAL
Neisseria Gonorrhea: NEGATIVE
Trichomonas: NEGATIVE

## 2023-01-30 LAB — RPR: RPR Ser Ql: NONREACTIVE

## 2023-01-30 LAB — HIV ANTIBODY (ROUTINE TESTING W REFLEX): HIV Screen 4th Generation wRfx: NONREACTIVE

## 2023-01-30 MED ORDER — FLUCONAZOLE 150 MG PO TABS: 150.0000 mg | ORAL_TABLET | Freq: Once | ORAL | 0 refills | Status: AC

## 2023-02-05 ENCOUNTER — Ambulatory Visit: Payer: Self-pay

## 2023-02-28 ENCOUNTER — Other Ambulatory Visit: Payer: Self-pay

## 2023-02-28 ENCOUNTER — Encounter: Payer: Self-pay | Admitting: *Deleted

## 2023-02-28 ENCOUNTER — Ambulatory Visit
Admission: EM | Admit: 2023-02-28 | Discharge: 2023-02-28 | Disposition: A | Discharge status: Home / Self Care | Payer: Self-pay | Attending: Family Medicine | Admitting: Family Medicine

## 2023-02-28 DIAGNOSIS — Z202 Contact with and (suspected) exposure to infections with a predominantly sexual mode of transmission: Secondary | ICD-10-CM | POA: Insufficient documentation
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
## 2023-02-28 NOTE — ED Triage Notes (Signed)
Pt wants to be retested for STD. Pt was tested 01-29-23. Pt does not have any Sx's .

## 2023-02-28 NOTE — Discharge Instructions (Signed)
Staff to notify you of anything positive on your swab  Follow-up with your OB/GYN

## 2023-03-01 LAB — CERVICOVAGINAL ANCILLARY ONLY
Bacterial Vaginitis (gardnerella): NEGATIVE
Candida Glabrata: NEGATIVE
Candida Vaginitis: POSITIVE — AB
Chlamydia: POSITIVE — AB
Comment: NEGATIVE
Comment: NEGATIVE
Comment: NEGATIVE
Comment: NEGATIVE
Comment: NEGATIVE
Comment: NORMAL
Neisseria Gonorrhea: NEGATIVE
Trichomonas: NEGATIVE

## 2023-03-04 ENCOUNTER — Telehealth (HOSPITAL_COMMUNITY): Payer: Self-pay | Admitting: Emergency Medicine

## 2023-03-04 MED ORDER — DOXYCYCLINE HYCLATE 100 MG PO CAPS: 100.0000 mg | ORAL_CAPSULE | Freq: Two times a day (BID) | ORAL | 0 refills | Status: AC

## 2023-03-04 MED ORDER — FLUCONAZOLE 150 MG PO TABS: 150.0000 mg | ORAL_TABLET | Freq: Once | ORAL | 0 refills | Status: AC

## 2023-12-12 ENCOUNTER — Ambulatory Visit
Admission: RE | Admit: 2023-12-12 | Discharge: 2023-12-12 | Disposition: A | Discharge status: Home / Self Care | Payer: Self-pay | Source: Ambulatory Visit

## 2023-12-12 VITALS — BP 117/84 | HR 71 | Temp 97.9°F | Resp 14

## 2023-12-12 DIAGNOSIS — Z202 Contact with and (suspected) exposure to infections with a predominantly sexual mode of transmission: Secondary | ICD-10-CM | POA: Insufficient documentation

## 2023-12-12 DIAGNOSIS — Z113 Encounter for screening for infections with a predominantly sexual mode of transmission: Secondary | ICD-10-CM | POA: Insufficient documentation

## 2023-12-12 NOTE — Discharge Instructions (Signed)
 STD testing is pending.  Will call if it is abnormal.

## 2023-12-12 NOTE — ED Provider Notes (Signed)
 [facility]    CSN: [id number] Arrival date & time: 12/12/23  1557      History   Chief Complaint Chief Complaint  Patient presents with   Exposure to STD    Need a check up - Entered by patient    HPI Cynthia Reeves is a 28 y.o. female.   Patient presents today for routine STD testing.  Denies any exposure or symptoms.  Last menstrual cycle completed a few days ago.   Exposure to STD    History reviewed. No pertinent past medical history.  Patient Active Problem List   Diagnosis Date Noted   Miscarriage 05/24/2019    History reviewed. No pertinent surgical history.  OB History     Gravida  1   Para      Term      Preterm      AB      Living         SAB      IAB      Ectopic      Multiple      Live Births               Home Medications    Prior to Admission medications   Medication Sig Start Date End Date Taking? Authorizing Provider  amoxicillin-clavulanate (AUGMENTIN) 500-125 MG tablet Take 1 tablet every 8 hours by oral route for 5 days. Patient not taking: Reported on 12/12/2023    [provider]  Ergocalciferol (VITAMIN D2 PO) TAKE 1 CAPSULE BY MOUTH WEEKLY FOR 6 WEEKS   Yes [provider]  promethazine (PHENERGAN) 25 MG tablet Take 1 tablet (25 mg total) by mouth every 6 (six) hours as needed for nausea or vomiting. 05/24/19 12/23/19  Calvert Cantor, CNM    Family History History reviewed. No pertinent family history.  Social History Social History   Tobacco Use   Smoking status: Never   Smokeless tobacco: Never  Vaping Use   Vaping status: Some Days   Substances: Flavoring  Substance Use Topics   Alcohol use: Yes    Comment: occ   Drug use: Never     Allergies   Amoxicillin   Review of Systems Review of Systems Per HPI  Physical Exam Triage Vital Signs ED Triage Vitals  Encounter Vitals Group     BP 12/12/23 1611 117/84     Systolic BP Percentile --      Diastolic  BP Percentile --      Pulse Rate 12/12/23 1611 71     Resp 12/12/23 1611 14     Temp 12/12/23 1611 97.9 F (36.6 C)     Temp Source 12/12/23 1611 Oral     SpO2 12/12/23 1611 100 %     Weight --      Height --      Head Circumference --      Peak Flow --      Pain Score 12/12/23 1612 0     Pain Loc --      Pain Education --      Exclude from Growth Chart --    No data found.  Updated Vital Signs BP 117/84 (BP Location: Left Arm)   Pulse 71   Temp 97.9 F (36.6 C) (Oral)   Resp 14   LMP 12/08/2023 (Exact Date)   SpO2 100%   Visual Acuity Right Eye Distance:   Left Eye Distance:   Bilateral Distance:    Right Eye Near:  Left Eye Near:    Bilateral Near:     Physical Exam Constitutional:      General: She is not in acute distress.    Appearance: Normal appearance. She is not toxic-appearing or diaphoretic.  HENT:     Head: Normocephalic and atraumatic.  Eyes:     Extraocular Movements: Extraocular movements intact.     Conjunctiva/sclera: Conjunctivae normal.  Pulmonary:     Effort: Pulmonary effort is normal.  Genitourinary:    Comments: Deferred with shared decision making. Self swab performed.  Neurological:     General: No focal deficit present.     Mental Status: She is alert and oriented to person, place, and time. Mental status is at baseline.  Psychiatric:        Mood and Affect: Mood normal.        Behavior: Behavior normal.        Thought Content: Thought content normal.        Judgment: Judgment normal.      UC Treatments / Results  Labs (all labs ordered are listed, but only abnormal results are displayed) Labs Reviewed  CERVICOVAGINAL ANCILLARY ONLY    EKG   Radiology No results found.  Procedures Procedures (including critical care time)  Medications Ordered in UC Medications - No data to display  Initial Impression / Assessment and Plan / UC Course  I have reviewed the triage vital signs and the nursing notes.  Pertinent  labs & imaging results that were available during my care of the patient were reviewed by me and considered in my medical decision making (see chart for details).     Cervicovaginal swab pending.  Patient requested blood work for HIV and syphilis but clinical staff was not able to obtain blood.  I am not able to order outpatient blood work so encouraged patient to return if she wishes to obtain blood work.  She states that she will return in a few days for another attempt.  Advised strict follow-up precautions.  Patient verbalized understanding and was agreeable with plan. Final Clinical Impressions(s) / UC Diagnoses   Final diagnoses:  Screening examination for venereal disease     Discharge Instructions      STD testing is pending.  Will call if it is abnormal.    ED Prescriptions   None    PDMP not reviewed this encounter.   Gustavus Bryant, [place] 12/12/23 [phone]

## 2023-12-12 NOTE — ED Triage Notes (Signed)
 Pt here for STD testing - swab & blood work. Asymptomatic and no known exposures. Reports last July she tested positive for chlamydia.

## 2023-12-13 LAB — CERVICOVAGINAL ANCILLARY ONLY
Chlamydia: NEGATIVE
Comment: NEGATIVE
Comment: NEGATIVE
Comment: NORMAL
Neisseria Gonorrhea: NEGATIVE
Trichomonas: NEGATIVE

## 2024-02-20 LAB — OB RESULTS CONSOLE RUBELLA ANTIBODY, IGM: Rubella: IMMUNE

## 2024-02-20 LAB — OB RESULTS CONSOLE HEPATITIS B SURFACE ANTIGEN: Hepatitis B Surface Ag: NEGATIVE

## 2024-02-20 LAB — OB RESULTS CONSOLE GC/CHLAMYDIA
Chlamydia: NEGATIVE
Neisseria Gonorrhea: NEGATIVE

## 2024-02-20 LAB — HEPATITIS C ANTIBODY: HCV Ab: NEGATIVE

## 2024-02-20 LAB — OB RESULTS CONSOLE RPR: RPR: NONREACTIVE

## 2024-02-20 LAB — OB RESULTS CONSOLE ANTIBODY SCREEN: Antibody Screen: NEGATIVE

## 2024-02-20 LAB — OB RESULTS CONSOLE HIV ANTIBODY (ROUTINE TESTING): HIV: NONREACTIVE

## 2024-02-21 ENCOUNTER — Other Ambulatory Visit: Payer: Self-pay | Admitting: Nurse Practitioner

## 2024-02-21 DIAGNOSIS — Z363 Encounter for antenatal screening for malformations: Secondary | ICD-10-CM

## 2024-04-22 ENCOUNTER — Other Ambulatory Visit: Payer: Self-pay | Admitting: *Deleted

## 2024-04-22 ENCOUNTER — Ambulatory Visit (HOSPITAL_BASED_OUTPATIENT_CLINIC_OR_DEPARTMENT_OTHER): Payer: Self-pay | Admitting: Maternal & Fetal Medicine

## 2024-04-22 ENCOUNTER — Other Ambulatory Visit: Payer: Self-pay

## 2024-04-22 ENCOUNTER — Other Ambulatory Visit: Payer: Self-pay | Admitting: Nurse Practitioner

## 2024-04-22 ENCOUNTER — Ambulatory Visit: Payer: Self-pay | Attending: Obstetrics and Gynecology

## 2024-04-22 DIAGNOSIS — Z3A19 19 weeks gestation of pregnancy: Secondary | ICD-10-CM | POA: Insufficient documentation

## 2024-04-22 DIAGNOSIS — E669 Obesity, unspecified: Secondary | ICD-10-CM

## 2024-04-22 DIAGNOSIS — N856 Intrauterine synechiae: Secondary | ICD-10-CM | POA: Diagnosis present

## 2024-04-22 DIAGNOSIS — Z363 Encounter for antenatal screening for malformations: Secondary | ICD-10-CM | POA: Insufficient documentation

## 2024-04-22 DIAGNOSIS — O34592 Maternal care for other abnormalities of gravid uterus, second trimester: Secondary | ICD-10-CM | POA: Diagnosis not present

## 2024-04-22 DIAGNOSIS — O99212 Obesity complicating pregnancy, second trimester: Secondary | ICD-10-CM | POA: Diagnosis not present

## 2024-04-22 DIAGNOSIS — O43192 Other malformation of placenta, second trimester: Secondary | ICD-10-CM | POA: Insufficient documentation

## 2024-04-22 NOTE — Progress Notes (Signed)
 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 G2P0 at [redacted]w[redacted]d here for ultrasound and consultation. She had Low risk aneuploidy screening of a female fetus.   Today we focused on the following:   Marginal cord insertion Marginal cord insertion was seen on today's ultrasound. There are no other evident fetal or placental abnormalities and the placenta is well away from the internal os.  I discussed the diagnosis, management and prognosis of pregnancy associated with the marginal umbilical cord insertion in pregnancy.  Normally the umbilical cord inserts centrally into the placenta, however, with a marginal cord insertion the umbilical cord inserts within less than 2 cm from the placental edge.  This is also known as a battledore placenta.  It occurs in about 6% of singleton pregnancies but is as high as 10 to 15% in twin pregnancies. Marginal cord insertion is even more common in monochorionic twins, and it has been associated with an increased risk for an SGA newborn in monochorionic but not dichorionic twin pregnancies. In singleton pregnancies, there are associations with adverse centric outcomes such as  placental abruption (odds ratio 1.5), placenta previa (odds ratio 1.8), and small-for-gestational age (SGA) neonate (odds ratio 1.2), but not perinatal deaths. I explained that because of the potential for fetal growth restriction, it is recommended that serial growth ultrasounds be performed during the pregnancy.  Uterine synechiae: A small band of tissue resembling uterine synechiae was seen today.  I discussed this  is unlikely to affect her pregnancy but is sometimes associated with malposition of the fetus.  Sonographic findings Single intrauterine pregnancy at 19w 3d  Fetal cardiac activity:  Observed and appears normal. Presentation: Breech. The anatomic structures that were well seen appear normal without evidence of soft markers. Due to poor acoustic windows some structures remain suboptimally visualized. Fetal biometry shows the estimated fetal weight at the 64 percentile.  Amniotic fluid: Within normal limits.  MVP: 4.95 cm. Placenta: Anterior. Adnexa: No abnormality visualized. Cervical length: 3.1 cm. Small uterine synechiae at the edge of the placenta.  There are limitations of prenatal ultrasound such as the inability to detect certain abnormalities due to poor visualization. Various factors such as fetal position, gestational age and maternal body habitus may increase the difficulty in visualizing the fetal anatomy.    Recommendations - EDD should be 09/13/2024 based on  LMP  (12/08/23). - Follow up ultrasound in 4-6 weeks to attempt visualization of the anatomy not seen and reassess the fetal growth.  Serial growth ultrasounds every 6 to 8 weeks starting the third trimester due to marginal cord insertion  Review of Systems: A review of systems was performed and was negative except per HPI   Past Obstetrical History:  OB History  Gravida Para Term Preterm AB Living  2       SAB IAB Ectopic Multiple Live Births          # Outcome Date GA Lbr Len/2nd Weight Sex Type Anes PTL Lv  2 Current           1 Gravida              Past  Medical History:  No past medical history on file.   Past Surgical History:   No past surgical history on file.   Home Medications:   Current Outpatient Medications on File Prior to Visit  Medication Sig Dispense Refill   amoxicillin -clavulanate (AUGMENTIN ) 500-125 MG tablet Take 1 tablet every 8 hours by oral route for 5 days. (Patient not taking:  Reported on 12/12/2023)     Ergocalciferol (VITAMIN D2 PO) TAKE 1 CAPSULE BY MOUTH WEEKLY FOR 6 WEEKS     [DISCONTINUED] promethazine  (PHENERGAN ) 25 MG tablet Take 1 tablet (25 mg total) by mouth every 6 (six) hours as needed for nausea or vomiting. 30 tablet 0   No current facility-administered medications on file prior to visit.      Allergies:   Allergies  Allergen Reactions   Amoxicillin      BV     Physical Exam:   There were no vitals filed for this visit. Sitting comfortably on the sonogram table Nonlabored breathing Normal rate and rhythm Abdomen is nontender  Thank you for the opportunity to be involved with this patient's care. Please let us  know if we can be of any further assistance.   30 minutes of time was spent reviewing the patient's chart including labs, imaging and documentation.  At least 50% of this time was spent with direct patient care discussing the diagnosis, management and prognosis of her care.  Sheralyn Shark MFM, [place]   04/22/2024  2:14 PM

## 2024-05-28 ENCOUNTER — Ambulatory Visit: Attending: Maternal & Fetal Medicine

## 2024-05-28 ENCOUNTER — Ambulatory Visit

## 2024-06-05 ENCOUNTER — Ambulatory Visit: Payer: Self-pay | Admitting: *Deleted

## 2024-06-05 NOTE — Telephone Encounter (Signed)
 FYI Only or Action Required?: FYI only for provider. No PCP  Patient was last seen in primary care on no PCP.  Called Nurse Triage reporting Cough.  Symptoms began several days ago. Wednesday   Interventions attempted: Rest, hydration, or home remedies.  Symptoms are: gradually worsening.  Triage Disposition: See PCP When Office is Open (Within 3 Days)  Patient/caregiver understands and will follow disposition?: Yes  Recommended UC .              Copied from CRM #[id number]. Topic: Clinical - Red Word Triage >> Jun 05, 2024 10:12 AM Fonda T wrote: [facility] that prompted transfer to Nurse Triage: Patient calling, states she has a really bad cough, headaches, nasal congestion, with a lot of mucous, increased fatigue. Reason for Disposition  Cough has been present for > 3 weeks    Cough since Wednesday not 3 weeks  Answer Assessment - Initial Assessment Questions Recommended UC. Patient scheduled new patient appt per request at [facility]. Recommended to review OTC medications and safety for pregnancy with pharmacist.  Can go to [facility] if available for testing of covid due to patient requesting to be tested for flu/ covid.      1. ONSET: When did the cough begin?      Wednesday  2. SEVERITY: How bad is the cough today?      Feeling worse today  3. SPUTUM: Describe the color of your sputum (e.g., none, dry cough; clear, white, yellow, green)     Yellow  4. HEMOPTYSIS: Are you coughing up any blood? If Yes, ask: How much? (e.g., flecks, streaks, tablespoons, etc.)     na 5. DIFFICULTY BREATHING: Are you having difficulty breathing? If Yes, ask: How bad is it? (e.g., mild, moderate, severe)      No  6. FEVER: Do you have a fever? If Yes, ask: What is your temperature, how was it measured, and when did it start?     No  7. CARDIAC HISTORY: Do you have any history of heart disease? (e.g., heart attack, congestive heart failure)      na 8. LUNG  HISTORY: Do you have any history of lung disease?  (e.g., pulmonary embolus, asthma, emphysema)     na 9. PE RISK FACTORS: Do you have a history of blood clots? (or: recent major surgery, recent prolonged travel, bedridden)     na 10. OTHER SYMPTOMS: Do you have any other symptoms? (e.g., runny nose, wheezing, chest pain)       Headaches cough with chest discomfort at times mucus yellow. Fatigue nasal congestion 11. PREGNANCY: Is there any chance you are pregnant? When was your last menstrual period?      [redacted] weeks pregnant 12. TRAVEL: Have you traveled out of the country in the last month? (e.g., travel history, exposures)       na  Protocols used: Cough - Acute Productive-A-AH

## 2024-06-23 ENCOUNTER — Inpatient Hospital Stay (HOSPITAL_COMMUNITY)
Admission: AD | Admit: 2024-06-23 | Discharge: 2024-06-23 | Disposition: A | Discharge status: Home / Self Care | Attending: Obstetrics and Gynecology | Admitting: Obstetrics and Gynecology

## 2024-06-23 ENCOUNTER — Encounter (HOSPITAL_COMMUNITY): Payer: Self-pay | Admitting: Obstetrics and Gynecology

## 2024-06-23 DIAGNOSIS — M549 Dorsalgia, unspecified: Secondary | ICD-10-CM

## 2024-06-23 DIAGNOSIS — Z3A28 28 weeks gestation of pregnancy: Secondary | ICD-10-CM

## 2024-06-23 DIAGNOSIS — O368130 Decreased fetal movements, third trimester, not applicable or unspecified: Secondary | ICD-10-CM

## 2024-06-23 DIAGNOSIS — Z3689 Encounter for other specified antenatal screening: Secondary | ICD-10-CM

## 2024-06-23 HISTORY — DX: Other specified health status: Z78.9

## 2024-06-23 LAB — URINALYSIS, ROUTINE W REFLEX MICROSCOPIC
Bilirubin Urine: NEGATIVE
Glucose, UA: NEGATIVE mg/dL
Hgb urine dipstick: NEGATIVE
Ketones, ur: 20 mg/dL — AB
Leukocytes,Ua: NEGATIVE
Nitrite: NEGATIVE
Protein, ur: NEGATIVE mg/dL
Specific Gravity, Urine: 1.01 (ref 1.005–1.030)
pH: 7 (ref 5.0–8.0)

## 2024-06-23 LAB — WET PREP, GENITAL
Clue Cells Wet Prep HPF POC: NONE SEEN
Sperm: NONE SEEN
Trich, Wet Prep: NONE SEEN
WBC, Wet Prep HPF POC: <10 (ref <10)
Yeast Wet Prep HPF POC: NONE SEEN

## 2024-06-23 MED ORDER — CYCLOBENZAPRINE HCL 5 MG PO TABS: 5.0000 mg | ORAL_TABLET | Freq: Three times a day (TID) | ORAL | 0 refills | Status: AC | PRN

## 2024-06-23 MED ORDER — CYCLOBENZAPRINE HCL 5 MG PO TABS
10.0000 mg | ORAL_TABLET | Freq: Once | ORAL | Status: AC
  Administered 2024-06-23: 10 mg via ORAL
  Filled 2024-06-23: qty 2

## 2024-06-23 MED ORDER — ACETAMINOPHEN 500 MG PO TABS
1000.0000 mg | ORAL_TABLET | Freq: Once | ORAL | Status: AC
  Administered 2024-06-23: 1000 mg via ORAL
  Filled 2024-06-23: qty 2

## 2024-06-23 MED ORDER — ACETAMINOPHEN 500 MG PO TABS: 1000.0000 mg | ORAL_TABLET | Freq: Three times a day (TID) | ORAL | 0 refills | Status: DC | PRN

## 2024-06-23 NOTE — MAU Provider Note (Cosign Needed Addendum)
 Chief Complaint:  Decreased Fetal Movement   HPI   None     Cynthia Reeves is a 28 y.o. G2P0010 at [redacted]w[redacted]d who presents to maternity admissions reporting decreased fetal movement. She has not felt much fetal movement today. She reports not feeling well overall today with headache, body aches, and lower abdominal cramping. Denies leaking of fluid, vaginal bleeding. She reports that her appetite has been low today, she had some [place] yogurt and blueberries for breakfast but nothing since then.  Pregnancy Course: Receives care at [facility]. Prenatal records reviewed. Patient has anterior placenta.  Past Medical History:  Diagnosis Date   Medical history non-contributory    OB History  Gravida Para Term Preterm AB Living  2    1   SAB IAB Ectopic Multiple Live Births  1        # Outcome Date GA Lbr Len/2nd Weight Sex Type Anes PTL Lv  2 Current           1 SAB 12/2017           Past Surgical History:  Procedure Laterality Date   NO PAST SURGERIES     History reviewed. No pertinent family history. Social History   Tobacco Use   Smoking status: Never   Smokeless tobacco: Never  Vaping Use   Vaping status: Some Days   Substances: Flavoring  Substance Use Topics   Alcohol use: Yes    Comment: occ   Drug use: Never   Allergies  Allergen Reactions   Amoxicillin      BV   Medications Prior to Admission  Medication Sig Dispense Refill Last Dose/Taking   ferrous sulfate 325 (65 FE) MG EC tablet Take 325 mg by mouth 3 (three) times daily with meals.   Taking   Prenatal Vit-Fe Fumarate-FA (PRENATAL MULTIVITAMIN) TABS tablet Take 1 tablet by mouth daily at 12 noon.   Taking   amoxicillin -clavulanate (AUGMENTIN ) 500-125 MG tablet Take 1 tablet every 8 hours by oral route for 5 days. (Patient not taking: Reported on 12/12/2023)      Ergocalciferol (VITAMIN D2 PO) TAKE 1 CAPSULE BY MOUTH WEEKLY FOR 6 WEEKS       I have reviewed patient's Past Medical Hx, Surgical Hx, Family Hx, Social  Hx, medications and allergies.   ROS  Pertinent items noted in HPI and remainder of comprehensive ROS otherwise negative.   PHYSICAL EXAM  Patient Vitals for the past 24 hrs:  BP Temp Pulse Resp SpO2 Height Weight  06/23/24 1918 (!) 135/59 -- -- -- -- -- --  06/23/24 1917 -- 98.1 F (36.7 C) 94 18 99 % 5' 5 (1.651 m) 108.4 kg    Constitutional: Well-developed, well-nourished female in no acute distress.  HEENT: atraumatic, normocephalic. Neck has normal ROM. EOM intact. Cardiovascular: normal rate & rhythm, warm and well-perfused Respiratory: normal effort, no problems with respiration noted GI: Abd soft, non-tender, non-distended MSK: Extremities nontender, no edema, normal ROM Skin: warm and dry. Acyanotic, no jaundice or pallor. Neurologic: Alert and oriented x 4. No abnormal coordination. Psychiatric: Normal mood. Speech not slurred, not rapid/pressured. Patient is cooperative. GU: no CVA tenderness Cervical exam chaperoned by Silvano Peers RN. Dilation: Fingertip Effacement (%): Thick Station: Ballotable Exam by:: Joesph Sear, PA   Fetal Tracing: Baseline FHR: 150 per minute Fetal heart variability: moderate Fetal Heart Rate accelerations: no Fetal Heart Rate decelerations: none Fetal Non-stress Test: Category I (reactive) - appropriate for gestational age Toco: no uterine contractions   Labs: Results  for orders placed or performed during the hospital encounter of 06/23/24 (from the past 24 hours)  Urinalysis, Routine w reflex microscopic -Urine, Clean Catch     Status: Abnormal   Collection Time: 06/23/24  7:30 PM  Result Value Ref Range   Color, Urine YELLOW YELLOW   APPearance HAZY (A) CLEAR   Specific Gravity, Urine 1.010 1.005 - 1.030   pH 7.0 5.0 - 8.0   Glucose, UA NEGATIVE NEGATIVE mg/dL   Hgb urine dipstick NEGATIVE NEGATIVE   Bilirubin Urine NEGATIVE NEGATIVE   Ketones, ur 20 (A) NEGATIVE mg/dL   Protein, ur NEGATIVE NEGATIVE mg/dL   Nitrite  NEGATIVE NEGATIVE   Leukocytes,Ua NEGATIVE NEGATIVE  Wet prep, genital     Status: None   Collection Time: 06/23/24  8:12 PM   Specimen: Vaginal  Result Value Ref Range   Yeast Wet Prep HPF POC NONE SEEN NONE SEEN   Trich, Wet Prep NONE SEEN NONE SEEN   Clue Cells Wet Prep HPF POC NONE SEEN NONE SEEN   WBC, Wet Prep HPF POC <10 <10   Sperm NONE SEEN     Imaging:  No results found.  MDM & MAU COURSE  MDM: Moderate  MAU Course: -Mild hypotension, otherwise Vital signs within normal limits.  -Patient has felt fetal movement while in MAU. -No cervical dilation, not in labor. UA and wet prep to rule out infection. -Encouraged to have ginger ale and crackers as blood sugar is likely low. -Headache resolved with Tylenol. Back pain improved but still present. Adding cyclobenzaprine. -UA and wet prep negative. -Even without accelerations, NST is reassuring, Category I, and given good variability is appropriate for gestational age. -Care handed over to oncoming provider Dr. Danny at 2100. Joesph Sear PA-C  Differential diagnosis considered for decreased fetal movement includes but is not limited to: fetal sleep, poor maternal perception of movement, medications, early gestational age, decreased/increased amniotic fluid volume, maternal position (sitting or standing versus lying), fetal position (anterior position of the fetal spine), anterior placenta, maternal physical activity   Orders Placed This Encounter  Procedures   Wet prep, genital   Urinalysis, Routine w reflex microscopic -Urine, Clean Catch   Meds ordered this encounter  Medications   acetaminophen (TYLENOL) tablet 1,000 mg   cyclobenzaprine (FLEXERIL) tablet 10 mg    ASSESSMENT  No diagnosis found.  PLAN  Discharge home in stable condition with return precautions.  Encouraged increased PO hydration and adequate nutritional intake.    Allergies as of 06/23/2024 - Review Complete 06/23/2024  Allergen Reaction Noted    Amoxicillin   12/23/2019     Joesph DELENA Sear, PA    This patient was signed out to me pending reactive NST and improvement in back pack. FHT was reassuring with moderate variability and then demonstrated accels appropriate for GA. Back pain has resolved with Flexeril. Patient reports she will use acetaminophen for pain relief, and she agrees to small supply of Flexeril. Rx sent. Patient DC home in stable condition with return precautions.   Barabara Danny, DO FMOB Fellow, Faculty Practice [facility], Center for [employer]

## 2024-06-23 NOTE — Discharge Instructions (Signed)
 Please return to the MAU (Maternity Assessment Unit) if you experience vaginal bleeding,leaking/gush of fluid like your water broke, notice decreased movement from your baby after doing kick counts, or contractions the are becoming more intense or more frequent.                    Safe Medications in Pregnancy    Acne: Benzoyl Peroxide Salicylic Acid  Backache/Headache: Tylenol: 2 regular strength every 4 hours OR              2 Extra strength every 6 hours  Colds/Coughs/Allergies: Benadryl (alcohol free) 25 mg every 6 hours as needed Breath right strips Claritin Cepacol throat lozenges Chloraseptic throat spray Cold-Eeze- up to three times per day Cough drops, alcohol free Flonase (by prescription only) Guaifenesin Mucinex Robitussin DM (plain only, alcohol free) Saline nasal spray/drops Sudafed (pseudoephedrine) & Actifed ** use only after [redacted] weeks gestation and if you do not have high blood pressure Tylenol Vicks Vaporub Zinc lozenges Zyrtec   Constipation: Colace Ducolax suppositories Fleet enema Glycerin suppositories Metamucil Milk of magnesia Miralax Senokot Smooth move tea  Diarrhea: Kaopectate Imodium A-D  *NO pepto Bismol  Hemorrhoids: Anusol Anusol HC Preparation H Tucks  Indigestion: Tums Maalox Mylanta Zantac  Pepcid  Insomnia: Benadryl (alcohol free) 25mg  every 6 hours as needed Tylenol PM Unisom, no Gelcaps  Leg Cramps: Tums MagGel  Nausea/Vomiting:  Bonine Dramamine Emetrol Ginger extract Sea bands Meclizine  Nausea medication to take during pregnancy:  Unisom (doxylamine succinate 25 mg tablets) Take one tablet daily at bedtime. If symptoms are not adequately controlled, the dose can be increased to a maximum recommended dose of two tablets daily (1/2 tablet in the morning, 1/2 tablet mid-afternoon and one at bedtime). Vitamin B6 100mg  tablets. Take one tablet twice a day (up to 200 mg per day).  Skin Rashes: Aveeno  products Benadryl cream or 25mg  every 6 hours as needed Calamine Lotion 1% cortisone cream  Yeast infection: Gyne-lotrimin 7 Monistat 7   **If taking multiple medications, please check labels to avoid duplicating the same active ingredients **take medication as directed on the label ** Do not exceed 4000 mg of tylenol in 24 hours **Do not take medications that contain aspirin or ibuprofen 

## 2024-06-23 NOTE — Progress Notes (Signed)
 Written and verbal d/c instructions given and pt voiced understanding.

## 2024-06-23 NOTE — Progress Notes (Signed)
 PT states she is feeling a lot of FM now.

## 2024-06-23 NOTE — MAU Note (Signed)
 Cynthia Reeves is a 28 y.o. at [redacted]w[redacted]d here in MAU reporting not feeling much FM today. Has not felt well today with cramping, h/a, and body aches. Thinks it is from her TDAP yesterday. Denies LOF or VB. Has not taken anything for pain.   LMP: na Onset of complaint: this am Pain score: 8 Vitals:   06/23/24 1917 06/23/24 1918  BP:  (!) 135/59  Pulse: 94   Resp: 18   Temp: 98.1 F (36.7 C)   SpO2: 99%      FHT: 146  Lab orders placed from triage: ua

## 2024-07-15 ENCOUNTER — Ambulatory Visit

## 2024-07-15 ENCOUNTER — Telehealth: Payer: Self-pay | Admitting: General Practice

## 2024-07-15 NOTE — Telephone Encounter (Signed)
 Called pt and left vm to call office back to reschedule missed appt

## 2024-07-22 ENCOUNTER — Other Ambulatory Visit: Payer: Self-pay | Admitting: *Deleted

## 2024-07-22 ENCOUNTER — Ambulatory Visit: Attending: Maternal & Fetal Medicine

## 2024-07-22 ENCOUNTER — Ambulatory Visit (HOSPITAL_BASED_OUTPATIENT_CLINIC_OR_DEPARTMENT_OTHER): Admitting: Obstetrics

## 2024-07-22 VITALS — BP 114/51 | HR 77

## 2024-07-22 DIAGNOSIS — O43192 Other malformation of placenta, second trimester: Secondary | ICD-10-CM | POA: Insufficient documentation

## 2024-07-22 DIAGNOSIS — N856 Intrauterine synechiae: Secondary | ICD-10-CM | POA: Diagnosis present

## 2024-07-22 DIAGNOSIS — O34593 Maternal care for other abnormalities of gravid uterus, third trimester: Secondary | ICD-10-CM

## 2024-07-22 DIAGNOSIS — O43193 Other malformation of placenta, third trimester: Secondary | ICD-10-CM

## 2024-07-22 DIAGNOSIS — O99213 Obesity complicating pregnancy, third trimester: Secondary | ICD-10-CM | POA: Diagnosis present

## 2024-07-22 DIAGNOSIS — Z3A32 32 weeks gestation of pregnancy: Secondary | ICD-10-CM | POA: Diagnosis present

## 2024-07-22 DIAGNOSIS — E669 Obesity, unspecified: Secondary | ICD-10-CM | POA: Diagnosis not present

## 2024-07-22 NOTE — Progress Notes (Signed)
 MFM Consult Note  Cynthia Reeves is currently at 32 weeks and 3 days.  She has been followed due to maternal obesity with a BMI of 36 and a marginal placental cord insertion noted during her prior exam.    She denies any problems since her last exam and reports that she has screened negative for gestational diabetes.  Sonographic findings Single intrauterine pregnancy at 32w 3d.  Fetal cardiac activity:  Observed and appears normal. Presentation: Breech. Fetal biometry shows the estimated fetal weight of 4 pounds 7 ounces which measures at the 44th percentile. Amniotic fluid volume: Within normal limits.  AFI: 14.61 cm.  MVP: 4.56 cm. Placenta: Anterior.  There are limitations of prenatal ultrasound such as the inability to detect certain abnormalities due to poor visualization. Various factors such as fetal position, gestational age and maternal body habitus may increase the difficulty in visualizing the fetal anatomy.    Breech presentation  The patient was advised that hopefully, the fetus will convert to a vertex presentation later in her pregnancy.    She understands that should the fetus remain in the breech presentation, a cesarean delivery may be necessary.    Due to maternal obesity and the marginal cord insertion, a follow-up exam was scheduled in 5 weeks to assess the fetal growth in the fetal position.    The patient stated that all of her questions were answered today.  A total of 20 minutes was spent counseling and coordinating the care for this patient.  Greater than 50% of the time was spent in direct face-to-face contact.

## 2024-08-27 ENCOUNTER — Ambulatory Visit: Attending: Obstetrics | Admitting: Obstetrics

## 2024-08-27 ENCOUNTER — Ambulatory Visit

## 2024-08-27 DIAGNOSIS — O43193 Other malformation of placenta, third trimester: Secondary | ICD-10-CM

## 2024-08-27 DIAGNOSIS — O43192 Other malformation of placenta, second trimester: Secondary | ICD-10-CM

## 2024-08-27 DIAGNOSIS — O99213 Obesity complicating pregnancy, third trimester: Secondary | ICD-10-CM

## 2024-08-27 DIAGNOSIS — Z3A37 37 weeks gestation of pregnancy: Secondary | ICD-10-CM

## 2024-08-27 DIAGNOSIS — O329XX0 Maternal care for malpresentation of fetus, unspecified, not applicable or unspecified: Secondary | ICD-10-CM

## 2024-08-27 NOTE — Progress Notes (Signed)
 MFM Consult Note  Cynthia Reeves is currently at 37 weeks and 4 days.  She has been followed due to maternal obesity with a BMI of 36 and a marginal placental cord insertion noted during her prior exam.  The fetus has been in the breech presentation since earlier in her pregnancy.  She denies any problems since her last exam and reports that she has screened negative for gestational diabetes.  Sonographic findings Single intrauterine pregnancy at 37w 4d.  Fetal cardiac activity:  Observed and appears normal. Presentation: Oblique breech. Fetal biometry shows the estimated fetal weight of 6 lb 15 oz,  3143g (49%). Amniotic fluid volume: Within normal limits. AFI: 17.76cm.  MVP: 7.17 cm. Placenta: Anterior.  There are limitations of prenatal ultrasound such as the inability to detect certain abnormalities due to poor visualization. Various factors such as fetal position, gestational age and maternal body habitus may increase the difficulty in visualizing the fetal anatomy.    Breech presentation  The patient was advised that the fetus remains in the breech presentation.  Management options for breech presentation at her current gestational age including an external cephalic version versus a cesarean delivery were discussed today.  She understands that should the fetus remain in the breech presentation or if the external cephalic version is unsuccessful, a cesarean delivery may be necessary.    She was advised that it appears that the fetus has room to turn.  However as she has an anterior placenta and due to maternal body habitus, sometimes the external cephalic version may not be successful.  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  The intrauterine synechiae may also prevent the fetus from converting to the vertex presentation.  She would like to attempt an external cephalic version as soon  as possible and will discuss scheduling the procedure with you during her prenatal visit tomorrow.  No further exams were scheduled in our office.  The patient stated that all of her questions were answered today.  A total of 20 minutes was spent counseling and coordinating the care for this patient.  Greater than 50% of the time was spent in direct face-to-face contact.

## 2024-08-31 ENCOUNTER — Telehealth (HOSPITAL_COMMUNITY): Payer: Self-pay | Admitting: *Deleted

## 2024-08-31 ENCOUNTER — Other Ambulatory Visit: Payer: Self-pay | Admitting: Obstetrics and Gynecology

## 2024-08-31 DIAGNOSIS — O329XX0 Maternal care for malpresentation of fetus, unspecified, not applicable or unspecified: Secondary | ICD-10-CM

## 2024-08-31 NOTE — Telephone Encounter (Signed)
 Preadmission screen

## 2024-09-01 ENCOUNTER — Encounter (HOSPITAL_COMMUNITY): Payer: Self-pay | Admitting: Obstetrics and Gynecology

## 2024-09-01 ENCOUNTER — Encounter (HOSPITAL_COMMUNITY): Payer: Self-pay | Admitting: Anesthesiology

## 2024-09-01 ENCOUNTER — Other Ambulatory Visit: Payer: Self-pay

## 2024-09-01 ENCOUNTER — Telehealth (HOSPITAL_COMMUNITY): Payer: Self-pay | Admitting: *Deleted

## 2024-09-01 ENCOUNTER — Observation Stay (HOSPITAL_COMMUNITY)

## 2024-09-01 ENCOUNTER — Encounter (HOSPITAL_COMMUNITY): Payer: Self-pay

## 2024-09-01 ENCOUNTER — Observation Stay (HOSPITAL_COMMUNITY)
Admission: AD | Admit: 2024-09-01 | Discharge: 2024-09-01 | Disposition: A | Discharge status: Home / Self Care | Attending: Family Medicine | Admitting: Family Medicine

## 2024-09-01 DIAGNOSIS — O321XX0 Maternal care for breech presentation, not applicable or unspecified: Secondary | ICD-10-CM | POA: Diagnosis present

## 2024-09-01 DIAGNOSIS — O329XX0 Maternal care for malpresentation of fetus, unspecified, not applicable or unspecified: Secondary | ICD-10-CM

## 2024-09-01 DIAGNOSIS — Z3A38 38 weeks gestation of pregnancy: Secondary | ICD-10-CM | POA: Diagnosis not present

## 2024-09-01 LAB — CBC
HCT: 33 % — ABNORMAL LOW (ref 36.0–46.0)
Hemoglobin: 11.3 g/dL — ABNORMAL LOW (ref 12.0–15.0)
MCH: 31.1 pg (ref 26.0–34.0)
MCHC: 34.2 g/dL (ref 30.0–36.0)
MCV: 90.9 fL (ref 80.0–100.0)
Platelets: 254 K/uL (ref 150–400)
RBC: 3.63 MIL/uL — ABNORMAL LOW (ref 3.87–5.11)
RDW: 13.2 % (ref 11.5–15.5)
WBC: 8.4 K/uL (ref 4.0–10.5)
nRBC: 0 % (ref 0.0–0.2)

## 2024-09-01 LAB — TYPE AND SCREEN
ABO/RH(D): B POS
Antibody Screen: NEGATIVE

## 2024-09-01 MED ORDER — LACTATED RINGERS IV SOLN: INTRAVENOUS | Status: DC

## 2024-09-01 MED ORDER — TERBUTALINE SULFATE 1 MG/ML IJ SOLN
0.2500 mg | INTRAMUSCULAR | Status: DC
  Administered 2024-09-01: 09:00:00 0.25 mg via SUBCUTANEOUS

## 2024-09-01 NOTE — Telephone Encounter (Signed)
 Preadmission screen

## 2024-09-01 NOTE — H&P (Signed)
 FACULTY PRACTICE ANTEPARTUM ADMISSION HISTORY AND PHYSICAL NOTE   History of Present Illness: Cynthia Reeves is a 28 y.o. G2P0010 at [redacted]w[redacted]d admitted for external cephalic version for breech presentation.   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    Past Medical History:  Diagnosis Date   Medical history non-contributory     Past Surgical History:  Procedure Laterality Date   NO PAST SURGERIES      OB History  Gravida Para Term Preterm AB Living  2    1   SAB IAB Ectopic Multiple Live Births  1        # Outcome Date GA Lbr Len/2nd Weight Sex Type Anes PTL Lv  2 Current           1 SAB 12/2017            Social History   Socioeconomic History   Marital status: Single    Spouse name: Not on file   Number of children: Not on file   Years of education: Not on file   Highest education level: Not on file  Occupational History   Not on file  Tobacco Use   Smoking status: Never   Smokeless tobacco: Never  Vaping Use   Vaping status: Some Days   Substances: Flavoring  Substance and Sexual Activity   Alcohol use: Yes    Comment: occ   Drug use: Never   Sexual activity: Yes    Birth control/protection: None  Other Topics Concern   Not on file  Social History Narrative   Not on file   Social Drivers of Health   Tobacco Use: Low Risk (09/01/2024)   Patient History    Smoking Tobacco Use: Never    Smokeless Tobacco Use: Never    Passive Exposure: Not on file  Financial Resource Strain: Not on file  Food Insecurity: Not on file  Transportation Needs: Not on file  Physical Activity: Not on file  Stress: Not on file  Social Connections: Not on file  Depression (EYV7-0): Not on file  Alcohol Screen: Not on file  Housing: Not on file  Utilities: Not on file  Health Literacy: Not on file    History  reviewed. No pertinent family history.  Allergies[1]  Medications Prior to Admission  Medication Sig Dispense Refill Last Dose/Taking   acetaminophen  (TYLENOL ) 500 MG tablet Take 2 tablets (1,000 mg total) by mouth every 8 (eight) hours as needed for moderate pain (pain score 4-6). 30 tablet 0    ferrous sulfate 325 (65 FE) MG EC tablet Take 325 mg by mouth 3 (three) times daily with meals.      Prenatal Vit-Fe Fumarate-FA (PRENATAL MULTIVITAMIN) TABS tablet Take 1 tablet by mouth daily at 12 noon.       Review of Systems - Negative  Vitals:  BP 104/72   Temp 97.9 F (36.6 C)   Resp 16   Ht 5' 5 (1.651 m)   Wt 116.9 kg   LMP 12/08/2023 (Exact Date)   BMI 42.90 kg/m  Physical Examination: Gen: alert, well appearing  Resp: normal work of breathing  Abd: gravid  Card: regular rate   Labs:  Results for orders placed or performed during the hospital encounter of 09/01/24 (from the past 24 hours)  CBC   Collection Time: 09/01/24  8:39 AM  Result Value  Ref Range   WBC 8.4 4.0 - 10.5 K/uL   RBC 3.63 (L) 3.87 - 5.11 MIL/uL   Hemoglobin 11.3 (L) 12.0 - 15.0 g/dL   HCT 66.9 (L) 63.9 - 53.9 %   MCV 90.9 80.0 - 100.0 fL   MCH 31.1 26.0 - 34.0 pg   MCHC 34.2 30.0 - 36.0 g/dL   RDW 86.7 88.4 - 84.4 %   Platelets 254 150 - 400 K/uL   nRBC 0.0 0.0 - 0.2 %  Type and screen   Collection Time: 09/01/24  8:39 AM  Result Value Ref Range   ABO/RH(D) PENDING    Antibody Screen PENDING    Sample Expiration      09/04/2024,2359 Performed at [facility], [address], [place], [place] [address]     Imaging Studies: US  MFM OB FOLLOW UP Result Date: 08/27/2024 ----------------------------------------------------------------------  OBSTETRICS REPORT                       (Signed Final 08/27/2024 04:07 pm) ---------------------------------------------------------------------- Patient Info  ID #:       [id number]                          D.O.B.:  [birth year] (28 yrs)(F)  Name:        Cynthia Reeves                Visit Date: 08/27/2024 12:28 pm ---------------------------------------------------------------------- Performed By  Attending:        Steffan Keys MD         Ref. Address:     [address]                                                             [place], [place]                                                             [address]  Performed By:     Rumaldo Sharps RDMS      [facility]         [facility]  Referred By:      [facility] Department ---------------------------------------------------------------------- Orders  #  Description                           Code        Ordered By  1  US  MFM OB FOLLOW UP                   23183.98    BABARA KEYS ----------------------------------------------------------------------  #  Order #                     Accession #                Episode #  1  [id number]                   [id number]                 [id number] ---------------------------------------------------------------------- Indications  Marginal insertion of umbilical cord affecting O43.193  management of mother in third trimester  Obesity complicating pregnancy, third          O99.213  trimester (BMI 36)  Uterine abnormality during pregnancy           O34.599  (synechea)  LR female, neg horizon  Encounter for other antenatal screening        Z36.2  follow-up  [redacted] weeks gestation of pregnancy                Z3A.37 ---------------------------------------------------------------------- Vital Signs  BP:          119/54 ---------------------------------------------------------------------- Fetal Evaluation  Num Of Fetuses:         1  Fetal Heart  Rate(bpm):  150  Cardiac Activity:       Observed  Presentation:           Oblique breech  Placenta:               Anterior  P. Cord Insertion:      Marg insertion previously seen  Amniotic Fluid  AFI FV:      Within normal limits  AFI Sum(cm)     %Tile       Largest Pocket(cm)  17.76           68          7.17  RUQ(cm)       RLQ(cm)       LUQ(cm)        LLQ(cm)  7.17          5.08          5.51           0 ---------------------------------------------------------------------- Biometry  BPD:      91.5  mm     G. Age:  37w 1d  58  %    CI:        76.52   %    70 - 86                                                          FL/HC:      21.7   %    20.9 - 22.7  HC:      331.4  mm     G. Age:  37w 5d         32  %    HC/AC:      0.99        0.92 - 1.05  AC:      333.5  mm     G. Age:  37w 2d         57  %    FL/BPD:     78.6   %    71 - 87  FL:       71.9  mm     G. Age:  36w 6d         32  %    FL/AC:      21.6   %    20 - 24  LV:        6.9  mm  Est. FW:    3143  gm    6 lb 15 oz      49  %  Est. FW at 39 Wks:       3429  gm     7 lb 9 oz ---------------------------------------------------------------------- OB History  Blood Type:   B+  Gravidity:    2          SAB:   1  Living:       0 ---------------------------------------------------------------------- Gestational Age  LMP:           37w 4d        Date:  12/08/23                  EDD:   09/13/24  U/S Today:     37w 2d                                        EDD:   09/15/24  Best:          37w 4d     Det. By:  LMP  (12/08/23)          EDD:   09/13/24 ---------------------------------------------------------------------- Anatomy  Cranium:               Previously seen        Aortic Arch:            Previously seen  Cavum:                 Previously seen        Ductal Arch:            Previously seen  Ventricles:            Appears normal         Diaphragm:              Appears normal  Choroid Plexus:        Previously seen        Stomach:                 Appears normal, left                                                                        sided  Cerebellum:            Previously seen        Abdomen:                Previously seen  Posterior Fossa:       Previously seen        Abdominal Wall:         Previously seen  Face:                  Orbits previously      Cord Vessels:           Previously seen                         seen  Lips:                  Previously seen        Kidneys:                Appear normal  Thoracic:              Previously seen        Bladder:                Appears normal  Heart:                 Previously seen        Spine:                  Previously seen  RVOT:                  Previously seen        Upper Extremities:      Previously seen  LVOT:                  Previously seen        Lower Extremities:      Previously seen  Other:  Technically difficult due to advanced gestational age and fetal          position. ---------------------------------------------------------------------- Cervix Uterus Adnexa  Cervix  Not visualized (advanced GA >24wks)  Uterus  No abnormality visualized.  Right Ovary  Not visualized.  Left Ovary  Not visualized.  Cul De Sac  No free fluid seen.  Adnexa  No abnormality visualized ---------------------------------------------------------------------- Comments  Latecia Miler is currently at 37 weeks and 4 days.  She  has been followed due to maternal obesity with a BMI of 36  and a marginal placental cord insertion noted during her prior  exam.  The fetus has been in the breech presentation since  earlier in her pregnancy.  She denies any problems since her last exam and reports  that she has screened negative for gestational diabetes.  Sonographic findings  Single intrauterine pregnancy at 37w 4d.  Fetal cardiac activity:  Observed and appears normal.  Presentation: Oblique breech.  Fetal biometry shows the estimated fetal weight of 6 lb 15 oz,  3143g (49%).  Amniotic fluid volume: Within normal  limits. AFI: 17.76cm.  MVP: 7.17 cm.  Placenta: Anterior.  There are limitations of prenatal ultrasound such as the  inability to detect certain abnormalities due to poor  visualization. Various factors such as fetal position,  gestational age and maternal body habitus may increase the  difficulty in visualizing the fetal anatomy.  Breech presentation  The patient was advised that the fetus remains in the breech  presentation.  Management options for breech presentation at her current  gestational age including an external cephalic version versus  a cesarean delivery were discussed today.  She understands that should the fetus remain in the breech  presentation or if the external cephalic version is  unsuccessful, a cesarean delivery may be necessary.  She was advised that it appears that the fetus has room to  turn.  However as she has an anterior placenta and due to  maternal body habitus, sometimes the external cephalic  version may not be successful.  Although an intrauterine synechiae was noted earlier in her  pregnancy, I could not visualize the intrauterine synechiae on  today's exam.  The intrauterine synechiae may also prevent  the fetus from converting to the vertex presentation.  She would like to attempt an external cephalic version as  soon as possible and will discuss scheduling the procedure  with you during her prenatal visit tomorrow.  No further exams were scheduled in our office.  The patient stated that all of her questions were answered  today.  A total of 20 minutes was spent counseling and coordinating  the care for this patient.  Greater than 50% of the time was  spent in direct face-to-face contact. 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   Steffan Rover, MD Attending Family Medicine Physician, [facility] for [facility], [facility]       [1]  Allergies Allergen Reactions   Amoxicillin      BV

## 2024-09-01 NOTE — Patient Instructions (Signed)
 Brisia Schuermann  09/01/2024   Your procedure is scheduled on:  12.21.2025  Arrive at 1130 at Entrance C on [address] [facility]  and [employer]. You are invited to use the FREE valet parking or use the Visitor's parking deck.  Pick up the phone at the desk and dial [phone].  Call this number if you have problems the morning of surgery: [phone]  Remember:   Do not eat food:(After Midnight) Desps de medianoche.  You may drink clear liquids until  __0930___.  Clear liquids means a liquid you can see thru.  It can have color such as Cola or Kool aid.  Tea is OK and coffee as long as no milk or creamer of any kind.  Take these medicines the morning of surgery with A SIP OF WATER:  none   Do not wear jewelry, make-up or nail polish.  Do not wear lotions, powders, or perfumes. Do not wear deodorant.  Do not shave 48 hours prior to surgery.  Do not bring valuables to the hospital.  [facility] is not   responsible for any belongings or valuables brought to the hospital.  Contacts, dentures or bridgework may not be worn into surgery.  Leave suitcase in the car. After surgery it may be brought to your room.  For patients admitted to the hospital, checkout time is 11:00 AM the day of              discharge.      Please read over the following fact sheets that you were given:     Preparing for Surgery

## 2024-09-01 NOTE — Discharge Summary (Signed)
 Antenatal Physician Discharge Summary  Patient ID: Cynthia Reeves MRN: [id number] DOB/AGE: 11-Apr-1996 28 y.o.  Admit date: 09/01/2024 Discharge date: 09/01/2024  Admission Diagnoses: Breech presentation [O32.1XX0]  Discharge Diagnoses:  Breech presentation [O32.1XX0]  Prenatal Procedures: External cephalic version  Consults: None  Hospital Course:  Cynthia Reeves is a 28 y.o. G2P0010 with IUP at [redacted]w[redacted]d brought to the PACU for external cephalic version due to breech presentation.  ECV was attempted, see separate procedure note.  Procedure was unsuccessful.  Fetus was monitored and a little reactivity after the procedure so fluid bolus was given.  Variability improved and patient ready for discharge.  Patient will be scheduled for 39-week C-section. Discharge Exam: Temp:  [97.8 F (36.6 C)-97.9 F (36.6 C)] 97.8 F (36.6 C) (12/16 1045) Resp:  [16] 16 (12/16 1045) BP: (104)/(58-72) 104/58 (12/16 1045) Weight:  [116.9 kg] 116.9 kg (12/16 0845)  Physical Examination: CONSTITUTIONAL: Well-developed, well-nourished female in no acute distress.  HENT:  Normocephalic, atraumatic EYES: Conjunctivae and EOM are normal. SKIN: Skin is warm and dry. No rash noted. Not diaphoretic. No erythema. No pallor. PSYCHIATRIC: Normal mood and affect. Normal behavior. Normal judgment and thought content. CARDIOVASCULAR: Normal heart rate noted RESPIRATORY: Effort normal, no problems with respiration noted MUSCULOSKELETAL: Normal range of motion. No edema and no tenderness. 2+ distal pulses. ABDOMEN: Soft, nontender, nondistended, gravid. CERVIX: Presentation: Complete Breech Exam by:: Izell, MD  Fetal monitoringBaseline: 140 bpm, Variability: Good {> 6 bpm), Accelerations: Reactive, and Decelerations: Absent Uterine activityNone  Significant Diagnostic Studies:  Results for orders placed or performed during the hospital encounter of 09/01/24 (from the past week)  CBC    Collection Time: 09/01/24  8:39 AM  Result Value Ref Range   WBC 8.4 4.0 - 10.5 K/uL   RBC 3.63 (L) 3.87 - 5.11 MIL/uL   Hemoglobin 11.3 (L) 12.0 - 15.0 g/dL   HCT 66.9 (L) 63.9 - 53.9 %   MCV 90.9 80.0 - 100.0 fL   MCH 31.1 26.0 - 34.0 pg   MCHC 34.2 30.0 - 36.0 g/dL   RDW 86.7 88.4 - 84.4 %   Platelets 254 150 - 400 K/uL   nRBC 0.0 0.0 - 0.2 %  Type and screen   Collection Time: 09/01/24  8:39 AM  Result Value Ref Range   ABO/RH(D) B POS    Antibody Screen NEG    Sample Expiration      09/04/2024,2359 Performed at [facility], [address], [place], [place] [address]    US  MFM OB FOLLOW UP Result Date: 08/27/2024 ----------------------------------------------------------------------  OBSTETRICS REPORT                       (Signed Final 08/27/2024 04:07 pm) ---------------------------------------------------------------------- Patient Info  ID #:       [id number]                          D.O.B.:  Oct 20, 1995 (28 yrs)(F)  Name:       Cynthia Reeves                Visit Date: 08/27/2024 12:28 pm ---------------------------------------------------------------------- Performed By  Attending:        Steffan Keys MD         Ref. Address:     [address]  [place], [place]                                                             [address]  Performed By:     Rumaldo Sharps RDMS      [facility]  Referred By:      [facility] Department ---------------------------------------------------------------------- Orders  #  Description                           Code        Ordered By   1  US  MFM OB FOLLOW UP                   23183.98    BABARA KEYS ----------------------------------------------------------------------  #  Order #                     Accession #                Episode #  1  [id number]                   [id number]                 [id number] ---------------------------------------------------------------------- Indications  Marginal insertion of umbilical cord affecting O43.193  management of mother in third trimester  Obesity complicating pregnancy, third          O99.213  trimester (BMI 36)  Uterine abnormality during pregnancy           O34.599  (  synechea)  LR female, neg horizon  Encounter for other antenatal screening        Z36.2  follow-up  [redacted] weeks gestation of pregnancy                Z3A.37 ---------------------------------------------------------------------- Vital Signs  BP:          119/54 ---------------------------------------------------------------------- Fetal Evaluation  Num Of Fetuses:         1  Fetal Heart Rate(bpm):  150  Cardiac Activity:       Observed  Presentation:           Oblique breech  Placenta:               Anterior  P. Cord Insertion:      Marg insertion previously seen  Amniotic Fluid  AFI FV:      Within normal limits  AFI Sum(cm)     %Tile       Largest Pocket(cm)  17.76           68          7.17  RUQ(cm)       RLQ(cm)       LUQ(cm)        LLQ(cm)  7.17          5.08          5.51           0 ---------------------------------------------------------------------- Biometry  BPD:      91.5  mm     G. Age:  37w 1d         58  %    CI:        76.52   %    70 - 86                                                          FL/HC:      21.7   %    20.9 - 22.7  HC:      331.4  mm     G. Age:  37w 5d         32  %    HC/AC:      0.99        0.92 - 1.05  AC:      333.5  mm     G. Age:  37w 2d         57  %    FL/BPD:     78.6   %    71 - 87  FL:       71.9  mm     G. Age:  36w 6d         32  %    FL/AC:      21.6   %    20 - 24  LV:        6.9  mm  Est. FW:     3143  gm    6 lb 15 oz      49  %  Est. FW at 39 Wks:       3429  gm     7 lb 9 oz ---------------------------------------------------------------------- OB History  Blood Type:   B+  Gravidity:    2  SAB:   1  Living:       0 ---------------------------------------------------------------------- Gestational Age  LMP:           37w 4d        Date:  12/08/23                  EDD:   09/13/24  U/S Today:     37w 2d                                        EDD:   09/15/24  Best:          37w 4d     Det. By:  LMP  (12/08/23)          EDD:   09/13/24 ---------------------------------------------------------------------- Anatomy  Cranium:               Previously seen        Aortic Arch:            Previously seen  Cavum:                 Previously seen        Ductal Arch:            Previously seen  Ventricles:            Appears normal         Diaphragm:              Appears normal  Choroid Plexus:        Previously seen        Stomach:                Appears normal, left                                                                        sided  Cerebellum:            Previously seen        Abdomen:                Previously seen  Posterior Fossa:       Previously seen        Abdominal Wall:         Previously seen  Face:                  Orbits previously      Cord Vessels:           Previously seen                         seen  Lips:                  Previously seen        Kidneys:                Appear normal  Thoracic:              Previously seen        Bladder:  Appears normal  Heart:                 Previously seen        Spine:                  Previously seen  RVOT:                  Previously seen        Upper Extremities:      Previously seen  LVOT:                  Previously seen        Lower Extremities:      Previously seen  Other:  Technically difficult due to advanced gestational age and fetal          position. ----------------------------------------------------------------------  Cervix Uterus Adnexa  Cervix  Not visualized (advanced GA >24wks)  Uterus  No abnormality visualized.  Right Ovary  Not visualized.  Left Ovary  Not visualized.  Cul De Sac  No free fluid seen.  Adnexa  No abnormality visualized ---------------------------------------------------------------------- Comments  Edna Rede is currently at 37 weeks and 4 days.  She  has been followed due to maternal obesity with a BMI of 36  and a marginal placental cord insertion noted during her prior  exam.  The fetus has been in the breech presentation since  earlier in her pregnancy.  She denies any problems since her last exam and reports  that she has screened negative for gestational diabetes.  Sonographic findings  Single intrauterine pregnancy at 37w 4d.  Fetal cardiac activity:  Observed and appears normal.  Presentation: Oblique breech.  Fetal biometry shows the estimated fetal weight of 6 lb 15 oz,  3143g (49%).  Amniotic fluid volume: Within normal limits. AFI: 17.76cm.  MVP: 7.17 cm.  Placenta: Anterior.  There are limitations of prenatal ultrasound such as the  inability to detect certain abnormalities due to poor  visualization. Various factors such as fetal position,  gestational age and maternal body habitus may increase the  difficulty in visualizing the fetal anatomy.  Breech presentation  The patient was advised that the fetus remains in the breech  presentation.  Management options for breech presentation at her current  gestational age including an external cephalic version versus  a cesarean delivery were discussed today.  She understands that should the fetus remain in the breech  presentation or if the external cephalic version is  unsuccessful, a cesarean delivery may be necessary.  She was advised that it appears that the fetus has room to  turn.  However as she has an anterior placenta and due to  maternal body habitus, sometimes the external cephalic  version may not be successful.  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  The intrauterine synechiae may also prevent  the fetus from converting to the vertex presentation.  She would like to attempt an external cephalic version as  soon as possible and will discuss scheduling the procedure  with you during her prenatal visit tomorrow.  No further exams were scheduled in our office.  The patient stated that all of her questions were answered  today.  A total of 20 minutes was spent counseling and coordinating  the care for this patient.  Greater than 50% of the time was  spent in direct face-to-face contact. ----------------------------------------------------------------------  Steffan Keys, MD Electronically Signed Final Report   08/27/2024 04:07 pm ----------------------------------------------------------------------    No future appointments.  Discharge Condition: Stable  Discharge disposition: 01-Home or Self Care       Discharge Instructions     Discharge patient   Complete by: As directed    Discharge disposition: 01-Home or Self Care   Discharge patient date: 09/01/2024      Allergies as of 09/01/2024       Reactions   Amoxicillin     BV        Medication List     TAKE these medications    acetaminophen  500 MG tablet Commonly known as: TYLENOL  Take 2 tablets (1,000 mg total) by mouth every 8 (eight) hours as needed for moderate pain (pain score 4-6).   ferrous sulfate 325 (65 FE) MG EC tablet Take 325 mg by mouth 3 (three) times daily with meals.   prenatal multivitamin Tabs tablet Take 1 tablet by mouth daily at 12 noon.        Signed: Norleen LULLA Rover M.D. 09/01/2024, 11:12 AM

## 2024-09-01 NOTE — Procedures (Signed)
 Date of procedure: 09/01/24  Pre-procedure diagnosis: 1. Single live intrauterine pregnancy at [redacted]w[redacted]d 2. Breech presentation  Post-procedure diagnosis: 1. Single live intrauterine pregnancy at [redacted]w[redacted]d  Procedure: External Cephalic Version Anesthesia: None Surgeon: Dr. Steffan Rover Assistant: Dr Izell and Dr Danny. An experienced assistant was required given the standard of surgical care given the complexity of the case.  This assistant was needed for exposure, dissection, suctioning, retraction, instrument exchange, and for overall help during the procedure.   Findings: Fetus in frank breech presentation; stable, reactive fetal heart rate pre and post procedure EBL: N/A  IVF: N/A  UOP: N/A Complications: None  Indication for procedure: Pt presents to L&D for ECV due to fetal malpresentation.  A reactive FHT was obtained prior to procedure.  Risks of ECV including abnormal fetal heart rate, PROM, abruption, injury to fetus, possible emergency c-section, and failed ECV were discussed with the pt and informed consent was obtained.  Informed consent was also obtained for c-section.  Pt elected for epidural prior to the procedure and this was placed without complication.  Details of Procedure: A bedside ultrasound was performed which confirmed the single intrauterine pregnancy and frank breech presentation.  There was noted to be adequate fluid.  Using manual pressure, the fetus was manipulated with gentle pressure from the palms against the buttock and posterior occiput to stimulate a forward roll.  4 attempts were made for version and fetal HR was obtained between each attempt and was reassuring.  The attempts were unsuccessful. FHT reactive after attempted version.  The patient and fetus tolerated the procedure well.  RH status: Positive   Steffan Rover, MD Attending Family Medicine Physician, [facility], [facility]

## 2024-09-02 ENCOUNTER — Encounter (HOSPITAL_COMMUNITY): Payer: Self-pay

## 2024-09-04 ENCOUNTER — Encounter (HOSPITAL_COMMUNITY)
Admission: RE | Admit: 2024-09-04 | Discharge: 2024-09-04 | Disposition: A | Discharge status: Home / Self Care | Source: Ambulatory Visit | Attending: Obstetrics and Gynecology | Admitting: Obstetrics and Gynecology

## 2024-09-04 ENCOUNTER — Other Ambulatory Visit: Payer: Self-pay | Admitting: Obstetrics and Gynecology

## 2024-09-04 DIAGNOSIS — Z01812 Encounter for preprocedural laboratory examination: Secondary | ICD-10-CM | POA: Diagnosis present

## 2024-09-04 DIAGNOSIS — O329XX0 Maternal care for malpresentation of fetus, unspecified, not applicable or unspecified: Secondary | ICD-10-CM | POA: Diagnosis not present

## 2024-09-04 LAB — CBC
HCT: 35.1 % — ABNORMAL LOW (ref 36.0–46.0)
Hemoglobin: 11.8 g/dL — ABNORMAL LOW (ref 12.0–15.0)
MCH: 31.1 pg (ref 26.0–34.0)
MCHC: 33.6 g/dL (ref 30.0–36.0)
MCV: 92.6 fL (ref 80.0–100.0)
Platelets: 242 K/uL (ref 150–400)
RBC: 3.79 MIL/uL — ABNORMAL LOW (ref 3.87–5.11)
RDW: 13.2 % (ref 11.5–15.5)
WBC: 7.8 K/uL (ref 4.0–10.5)
nRBC: 0 % (ref 0.0–0.2)

## 2024-09-05 ENCOUNTER — Encounter (HOSPITAL_COMMUNITY): Payer: Self-pay | Admitting: Obstetrics and Gynecology

## 2024-09-05 LAB — SYPHILIS: RPR W/REFLEX TO RPR TITER AND TREPONEMAL ANTIBODIES, TRADITIONAL SCREENING AND DIAGNOSIS ALGORITHM: RPR Ser Ql: NONREACTIVE

## 2024-09-05 NOTE — Anesthesia Preprocedure Evaluation (Signed)
 "                                  Anesthesia Evaluation  Patient identified by MRN, date of birth, ID band Patient awake    Reviewed: Allergy & Precautions, NPO status , Patient's Chart, lab work & pertinent test results  History of Anesthesia Complications Negative for: history of anesthetic complications  Airway Mallampati: II  TM Distance: >3 FB Neck ROM: Full    Dental  (+) Dental Advisory Given, Teeth Intact   Pulmonary neg pulmonary ROS   Pulmonary exam normal        Cardiovascular negative cardio ROS Normal cardiovascular exam     Neuro/Psych negative neurological ROS  negative psych ROS   GI/Hepatic Neg liver ROS,GERD  Medicated and Controlled,,  Endo/Other    Class 3 obesity  Renal/GU negative Renal ROS  negative genitourinary   Musculoskeletal negative musculoskeletal ROS (+)    Abdominal  (+) + obese  Peds  Hematology negative hematology ROS (+) Blood dyscrasia, anemia Lab Results      Component                Value               Date                      WBC                      7.8                 09/04/2024                HGB                      11.8 (L)            09/04/2024                HCT                      35.1 (L)            09/04/2024                MCV                      92.6                09/04/2024                PLT                      242                 09/04/2024              Anesthesia Other Findings   Reproductive/Obstetrics (+) Pregnancy  Breech presentation Hx/o uterine synechiae Marginal insertion 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                              Anesthesia Physical Anesthesia Plan  ASA: 3  Anesthesia Plan: Spinal   Post-op Pain Management: Minimal or no pain anticipated   Induction: Intravenous  PONV Risk Score and Plan: 4 or greater and Treatment may vary due to  age or medical condition, Scopolamine  patch - Pre-op and Ondansetron   Airway Management Planned: Natural  Airway  Additional Equipment: None  Intra-op Plan:   Post-operative Plan:   Informed Consent: I have reviewed the patients History and Physical, chart, labs and discussed the procedure including the risks, benefits and alternatives for the proposed anesthesia with the patient or authorized representative who has indicated his/her understanding and acceptance.       Plan Discussed with: CRNA and Anesthesiologist  Anesthesia Plan Comments: (Labs reviewed, platelets acceptable. Discussed risks and benefits of spinal, including spinal/epidural hematoma, infection, failed block, and PDPH. Patient expressed understanding and wished to proceed. )         Anesthesia Quick Evaluation  "

## 2024-09-06 ENCOUNTER — Encounter (HOSPITAL_COMMUNITY): Payer: Self-pay | Admitting: Obstetrics and Gynecology

## 2024-09-06 NOTE — Progress Notes (Signed)
 Called patient to notify of schedule change for for c-section to 09/07/24 at 0730. Pt instructed to follow previously given pre-op instructions including chlorhexidine  bath, NPO p MN, and arrival two hours prior to scheduled time (09/07/24 0530). Pt verbalized understanding.

## 2024-09-07 ENCOUNTER — Encounter (HOSPITAL_COMMUNITY): Payer: Self-pay | Admitting: Obstetrics and Gynecology

## 2024-09-07 ENCOUNTER — Inpatient Hospital Stay (HOSPITAL_COMMUNITY)
Admission: AD | Admit: 2024-09-07 | Discharge: 2024-09-09 | DRG: 787 | Disposition: A | Discharge status: Home / Self Care | Source: Ambulatory Visit | Attending: Family Medicine | Admitting: Family Medicine

## 2024-09-07 ENCOUNTER — Other Ambulatory Visit: Payer: Self-pay

## 2024-09-07 ENCOUNTER — Inpatient Hospital Stay (HOSPITAL_COMMUNITY): Payer: Self-pay | Admitting: Anesthesiology

## 2024-09-07 ENCOUNTER — Encounter (HOSPITAL_COMMUNITY)
Admission: AD | Disposition: A | Discharge status: Home / Self Care | Payer: Self-pay | Source: Ambulatory Visit | Attending: Family Medicine

## 2024-09-07 DIAGNOSIS — D62 Acute posthemorrhagic anemia: Secondary | ICD-10-CM | POA: Diagnosis not present

## 2024-09-07 DIAGNOSIS — O321XX0 Maternal care for breech presentation, not applicable or unspecified: Principal | ICD-10-CM | POA: Diagnosis present

## 2024-09-07 DIAGNOSIS — O43193 Other malformation of placenta, third trimester: Secondary | ICD-10-CM | POA: Diagnosis present

## 2024-09-07 DIAGNOSIS — O99334 Smoking (tobacco) complicating childbirth: Secondary | ICD-10-CM | POA: Diagnosis present

## 2024-09-07 DIAGNOSIS — E66813 Obesity, class 3: Secondary | ICD-10-CM | POA: Diagnosis present

## 2024-09-07 DIAGNOSIS — K219 Gastro-esophageal reflux disease without esophagitis: Secondary | ICD-10-CM | POA: Diagnosis present

## 2024-09-07 DIAGNOSIS — O9962 Diseases of the digestive system complicating childbirth: Secondary | ICD-10-CM | POA: Diagnosis present

## 2024-09-07 DIAGNOSIS — O99214 Obesity complicating childbirth: Secondary | ICD-10-CM | POA: Diagnosis present

## 2024-09-07 DIAGNOSIS — O9081 Anemia of the puerperium: Secondary | ICD-10-CM | POA: Diagnosis not present

## 2024-09-07 DIAGNOSIS — O4423 Partial placenta previa NOS or without hemorrhage, third trimester: Secondary | ICD-10-CM | POA: Diagnosis not present

## 2024-09-07 DIAGNOSIS — F17290 Nicotine dependence, other tobacco product, uncomplicated: Secondary | ICD-10-CM | POA: Diagnosis present

## 2024-09-07 DIAGNOSIS — Z23 Encounter for immunization: Secondary | ICD-10-CM | POA: Diagnosis not present

## 2024-09-07 DIAGNOSIS — Z3A39 39 weeks gestation of pregnancy: Secondary | ICD-10-CM

## 2024-09-07 DIAGNOSIS — Z3490 Encounter for supervision of normal pregnancy, unspecified, unspecified trimester: Principal | ICD-10-CM

## 2024-09-07 LAB — TYPE AND SCREEN
ABO/RH(D): B POS
Antibody Screen: NEGATIVE

## 2024-09-07 SURGERY — Surgical Case
Anesthesia: Spinal

## 2024-09-07 MED ORDER — IBUPROFEN 600 MG PO TABS
600.0000 mg | ORAL_TABLET | Freq: Four times a day (QID) | ORAL | Status: DC
  Administered 2024-09-08 – 2024-09-09 (×5): 600 mg via ORAL
  Filled 2024-09-07 (×5): qty 1

## 2024-09-07 MED ORDER — DEXMEDETOMIDINE HCL IN NACL 80 MCG/20ML IV SOLN
INTRAVENOUS | Status: DC | PRN
  Administered 2024-09-07: 12 ug via INTRAVENOUS
  Administered 2024-09-07: 8 ug via INTRAVENOUS

## 2024-09-07 MED ORDER — MORPHINE SULFATE (PF) 0.5 MG/ML IJ SOLN
INTRAMUSCULAR | Status: DC | PRN
  Administered 2024-09-07: 150 ug via INTRATHECAL

## 2024-09-07 MED ORDER — SOD CITRATE-CITRIC ACID 500-334 MG/5ML PO SOLN: 30.0000 mL | Freq: Once | ORAL | Status: DC

## 2024-09-07 MED ORDER — SIMETHICONE 80 MG PO CHEW
80.0000 mg | CHEWABLE_TABLET | Freq: Three times a day (TID) | ORAL | Status: DC
  Administered 2024-09-07 – 2024-09-09 (×6): 80 mg via ORAL
  Filled 2024-09-07 (×6): qty 1

## 2024-09-07 MED ORDER — BUPIVACAINE IN DEXTROSE 0.75-8.25 % IT SOLN
INTRATHECAL | Status: DC | PRN
  Administered 2024-09-07: 1.6 mL via INTRATHECAL

## 2024-09-07 MED ORDER — PHENYLEPHRINE HCL-NACL 20-0.9 MG/250ML-% IV SOLN
INTRAVENOUS | Status: DC | PRN
  Administered 2024-09-07: 60 ug/min via INTRAVENOUS

## 2024-09-07 MED ORDER — LACTATED RINGERS IV SOLN: INTRAVENOUS | Status: DC

## 2024-09-07 MED ORDER — OXYTOCIN-SODIUM CHLORIDE 30-0.9 UT/500ML-% IV SOLN: 2.5000 [IU]/h | INTRAVENOUS | Status: AC

## 2024-09-07 MED ORDER — OXYTOCIN-SODIUM CHLORIDE 30-0.9 UT/500ML-% IV SOLN
INTRAVENOUS | Status: DC | PRN
  Administered 2024-09-07 (×2): 30 [IU] via INTRAVENOUS

## 2024-09-07 MED ORDER — KETOROLAC TROMETHAMINE 30 MG/ML IJ SOLN: 30.0000 mg | Freq: Four times a day (QID) | INTRAMUSCULAR | Status: AC | PRN

## 2024-09-07 MED ORDER — DIBUCAINE (PERIANAL) 1 % EX OINT: 1.0000 | TOPICAL_OINTMENT | CUTANEOUS | Status: DC | PRN

## 2024-09-07 MED ORDER — SCOPOLAMINE 1 MG/3DAYS TD PT72: 1.0000 | MEDICATED_PATCH | Freq: Once | TRANSDERMAL | Status: DC

## 2024-09-07 MED ORDER — DEXAMETHASONE SOD PHOSPHATE PF 10 MG/ML IJ SOLN
INTRAMUSCULAR | Status: DC | PRN
  Administered 2024-09-07: 10 mg via INTRAVENOUS

## 2024-09-07 MED ORDER — PRENATAL MULTIVITAMIN CH
1.0000 | ORAL_TABLET | Freq: Every day | ORAL | Status: DC
  Administered 2024-09-08 – 2024-09-09 (×2): 1 via ORAL
  Filled 2024-09-07 (×2): qty 1

## 2024-09-07 MED ORDER — KETOROLAC TROMETHAMINE 30 MG/ML IJ SOLN
30.0000 mg | Freq: Four times a day (QID) | INTRAMUSCULAR | Status: AC
  Administered 2024-09-07 – 2024-09-08 (×4): 30 mg via INTRAVENOUS
  Filled 2024-09-07 (×4): qty 1

## 2024-09-07 MED ORDER — SODIUM CHLORIDE 0.9% FLUSH: 3.0000 mL | INTRAVENOUS | Status: DC | PRN

## 2024-09-07 MED ORDER — FENTANYL CITRATE (PF) 100 MCG/2ML IJ SOLN: 25.0000 ug | INTRAMUSCULAR | Status: DC | PRN

## 2024-09-07 MED ORDER — AMISULPRIDE (ANTIEMETIC) 5 MG/2ML IV SOLN: 10.0000 mg | Freq: Once | INTRAVENOUS | Status: DC | PRN

## 2024-09-07 MED ORDER — CEFAZOLIN SODIUM-DEXTROSE 2-4 GM/100ML-% IV SOLN
2.0000 g | INTRAVENOUS | Status: AC
  Administered 2024-09-07: 2 g via INTRAVENOUS

## 2024-09-07 MED ORDER — POVIDONE-IODINE 10 % EX SWAB
2.0000 | Freq: Once | CUTANEOUS | Status: AC
  Administered 2024-09-07: 2 via TOPICAL

## 2024-09-07 MED ORDER — MORPHINE SULFATE (PF) 0.5 MG/ML IJ SOLN
INTRAMUSCULAR | Status: AC
  Filled 2024-09-07: qty 10

## 2024-09-07 MED ORDER — ACETAMINOPHEN 160 MG/5ML PO SOLN: 960.0000 mg | Freq: Once | ORAL | Status: AC

## 2024-09-07 MED ORDER — FENTANYL CITRATE (PF) 100 MCG/2ML IJ SOLN
INTRAMUSCULAR | Status: DC | PRN
  Administered 2024-09-07: 15 ug via INTRATHECAL

## 2024-09-07 MED ORDER — MENTHOL 3 MG MT LOZG: 1.0000 | LOZENGE | OROMUCOSAL | Status: DC | PRN

## 2024-09-07 MED ORDER — OXYCODONE HCL 5 MG PO TABS: 5.0000 mg | ORAL_TABLET | Freq: Once | ORAL | Status: DC | PRN

## 2024-09-07 MED ORDER — SCOPOLAMINE 1 MG/3DAYS TD PT72
1.0000 | MEDICATED_PATCH | Freq: Once | TRANSDERMAL | Status: DC
  Administered 2024-09-07: 1 mg via TRANSDERMAL
  Filled 2024-09-07: qty 1

## 2024-09-07 MED ORDER — DIPHENHYDRAMINE HCL 25 MG PO CAPS
25.0000 mg | ORAL_CAPSULE | Freq: Four times a day (QID) | ORAL | Status: DC | PRN
  Administered 2024-09-08: 25 mg via ORAL

## 2024-09-07 MED ORDER — ONDANSETRON HCL 4 MG/2ML IJ SOLN
4.0000 mg | Freq: Three times a day (TID) | INTRAMUSCULAR | Status: DC | PRN
  Administered 2024-09-07: 4 mg via INTRAVENOUS
  Filled 2024-09-07: qty 2

## 2024-09-07 MED ORDER — ORAL CARE MOUTH RINSE: 15.0000 mL | Freq: Once | OROMUCOSAL | Status: DC

## 2024-09-07 MED ORDER — NALOXONE HCL 4 MG/10ML IJ SOLN: 1.0000 ug/kg/h | INTRAVENOUS | Status: DC | PRN

## 2024-09-07 MED ORDER — FENTANYL CITRATE (PF) 100 MCG/2ML IJ SOLN
INTRAMUSCULAR | Status: AC
  Filled 2024-09-07: qty 2

## 2024-09-07 MED ORDER — COCONUT OIL OIL: 1.0000 | TOPICAL_OIL | Status: DC | PRN

## 2024-09-07 MED ORDER — OXYTOCIN-SODIUM CHLORIDE 30-0.9 UT/500ML-% IV SOLN
INTRAVENOUS | Status: AC
  Filled 2024-09-07: qty 500

## 2024-09-07 MED ORDER — SODIUM CHLORIDE 0.9 % IV SOLN: 12.5000 mg | INTRAVENOUS | Status: DC | PRN

## 2024-09-07 MED ORDER — DIPHENHYDRAMINE HCL 50 MG/ML IJ SOLN: 12.5000 mg | INTRAMUSCULAR | Status: DC | PRN

## 2024-09-07 MED ORDER — STERILE WATER FOR IRRIGATION IR SOLN
Status: DC | PRN
  Administered 2024-09-07: 1000 mL

## 2024-09-07 MED ORDER — SIMETHICONE 80 MG PO CHEW: 80.0000 mg | CHEWABLE_TABLET | ORAL | Status: DC | PRN

## 2024-09-07 MED ORDER — MEPERIDINE HCL 25 MG/ML IJ SOLN: 6.2500 mg | INTRAMUSCULAR | Status: DC | PRN

## 2024-09-07 MED ORDER — SODIUM CHLORIDE 0.9 % IR SOLN
Status: DC | PRN
  Administered 2024-09-07: 1

## 2024-09-07 MED ORDER — MORPHINE SULFATE (PF) 0.5 MG/ML IJ SOLN: INTRAMUSCULAR | Status: DC | PRN

## 2024-09-07 MED ORDER — OXYCODONE HCL 5 MG/5ML PO SOLN: 5.0000 mg | Freq: Once | ORAL | Status: DC | PRN

## 2024-09-07 MED ORDER — SENNOSIDES-DOCUSATE SODIUM 8.6-50 MG PO TABS
2.0000 | ORAL_TABLET | Freq: Every day | ORAL | Status: DC
  Administered 2024-09-08 – 2024-09-09 (×2): 2 via ORAL
  Filled 2024-09-07 (×2): qty 2

## 2024-09-07 MED ORDER — DIPHENHYDRAMINE HCL 25 MG PO CAPS
25.0000 mg | ORAL_CAPSULE | ORAL | Status: DC | PRN
  Filled 2024-09-07: qty 1

## 2024-09-07 MED ORDER — CEFAZOLIN SODIUM-DEXTROSE 2-4 GM/100ML-% IV SOLN
INTRAVENOUS | Status: AC
  Filled 2024-09-07: qty 100

## 2024-09-07 MED ORDER — INFLUENZA VIRUS VACC SPLIT PF (FLUZONE) 0.5 ML IM SUSY
0.5000 mL | PREFILLED_SYRINGE | INTRAMUSCULAR | Status: AC
  Administered 2024-09-09: 0.5 mL via INTRAMUSCULAR
  Filled 2024-09-07: qty 0.5

## 2024-09-07 MED ORDER — DEXMEDETOMIDINE HCL IN NACL 80 MCG/20ML IV SOLN
INTRAVENOUS | Status: AC
  Filled 2024-09-07: qty 20

## 2024-09-07 MED ORDER — FENTANYL CITRATE (PF) 100 MCG/2ML IJ SOLN: INTRAMUSCULAR | Status: DC | PRN

## 2024-09-07 MED ORDER — ACETAMINOPHEN 10 MG/ML IV SOLN
INTRAVENOUS | Status: DC | PRN
  Administered 2024-09-07: 1000 mg via INTRAVENOUS

## 2024-09-07 MED ORDER — ZOLPIDEM TARTRATE 5 MG PO TABS: 5.0000 mg | ORAL_TABLET | Freq: Every evening | ORAL | Status: DC | PRN

## 2024-09-07 MED ORDER — ACETAMINOPHEN 500 MG PO TABS
1000.0000 mg | ORAL_TABLET | Freq: Once | ORAL | Status: AC
  Administered 2024-09-07: 1000 mg via ORAL
  Filled 2024-09-07: qty 2

## 2024-09-07 MED ORDER — WITCH HAZEL-GLYCERIN EX PADS: 1.0000 | MEDICATED_PAD | CUTANEOUS | Status: DC | PRN

## 2024-09-07 MED ORDER — NALOXONE HCL 0.4 MG/ML IJ SOLN: 0.4000 mg | INTRAMUSCULAR | Status: DC | PRN

## 2024-09-07 MED ORDER — CHLORHEXIDINE GLUCONATE 0.12 % MT SOLN
15.0000 mL | Freq: Once | OROMUCOSAL | Status: DC
  Filled 2024-09-07: qty 15

## 2024-09-07 MED ORDER — FAMOTIDINE 20 MG PO TABS: 20.0000 mg | ORAL_TABLET | Freq: Once | ORAL | Status: DC

## 2024-09-07 MED ADMIN — Ondansetron HCl Inj 4 MG/2ML (2 MG/ML): 4 mg | INTRAVENOUS | NDC 60505613005

## 2024-09-07 SURGICAL SUPPLY — 30 items
BENZOIN TINCTURE PRP APPL 2/3 (GAUZE/BANDAGES/DRESSINGS) ×1 IMPLANT
CANISTER SUCT 3000ML PPV (MISCELLANEOUS) ×1 IMPLANT
CHLORAPREP W/TINT 26 (MISCELLANEOUS) ×2 IMPLANT
CLAMP UMBILICAL CORD (MISCELLANEOUS) ×1 IMPLANT
DERMABOND ADVANCED .7 DNX12 (GAUZE/BANDAGES/DRESSINGS) IMPLANT
DRSG OPSITE POSTOP 4X10 (GAUZE/BANDAGES/DRESSINGS) ×1 IMPLANT
ELECTRODE REM PT RTRN 9FT ADLT (ELECTROSURGICAL) ×1 IMPLANT
EXTRACTOR VACUUM KIWI (MISCELLANEOUS) ×1 IMPLANT
GLOVE BIOGEL PI IND STRL 7.0 (GLOVE) ×2 IMPLANT
GLOVE BIOGEL PI IND STRL 7.5 (GLOVE) ×1 IMPLANT
GLOVE SURG SS PI 7.0 STRL IVOR (GLOVE) ×1 IMPLANT
GOWN STRL REUS W/ TWL LRG LVL3 (GOWN DISPOSABLE) ×2 IMPLANT
GOWN STRL REUS W/ TWL XL LVL3 (GOWN DISPOSABLE) ×1 IMPLANT
MAT PREVALON FULL STRYKER (MISCELLANEOUS) IMPLANT
NS IRRIG 1000ML POUR BTL (IV SOLUTION) ×1 IMPLANT
PACK C SECTION WH (CUSTOM PROCEDURE TRAY) ×1 IMPLANT
PAD ABD 7.5X8 STRL (GAUZE/BANDAGES/DRESSINGS) ×1 IMPLANT
PAD OB MATERNITY 4.3X12.25 (PERSONAL CARE ITEMS) ×1 IMPLANT
PAD PREP 24X48 CUFFED NSTRL (MISCELLANEOUS) ×1 IMPLANT
RETRACTOR WND ALEXIS 25 LRG (MISCELLANEOUS) ×1 IMPLANT
STRIP CLOSURE SKIN 1/2X4 (GAUZE/BANDAGES/DRESSINGS) ×1 IMPLANT
SUT MNCRL 0 VIOLET CTX 36 (SUTURE) ×2 IMPLANT
SUT MON AB 4-0 PS1 27 (SUTURE) ×1 IMPLANT
SUT VIC AB 0 CT1 36 (SUTURE) ×1 IMPLANT
SUT VIC AB 0 CTX36XBRD ANBCTRL (SUTURE) IMPLANT
SUT VIC AB 4-0 KS 27 (SUTURE) IMPLANT
SUT VICRYL+ 3-0 36IN CT-1 (SUTURE) ×1 IMPLANT
SUTURE PLAIN GUT 2.0 ETHICON (SUTURE) ×1 IMPLANT
TOWEL OR 17X24 6PK STRL BLUE (TOWEL DISPOSABLE) ×2 IMPLANT
WATER STERILE IRR 1000ML POUR (IV SOLUTION) ×1 IMPLANT

## 2024-09-07 NOTE — Lactation Note (Signed)
 This note was copied from a baby's chart. Lactation Consultation Note  Patient Name: Cynthia Reeves Date: 09/07/2024 Age:26 hours Reason for consult: Initial assessment;1st time breastfeeding  P1, Baby skin to skin.  Attempted latching in football hold but baby was sleepy. Mother hand expressed good flow of colostrum. Gave baby drops 4 ml on a spoon.  Recommend attempting to latch again if baby cues or within 1.5-2 hours.  Suggest calling for [facility] assistance as needed.  Advised mother to rest while baby is sleeping. Maternal grandmother in room.  Maternal Data Has patient been taught Hand Expression?: Yes Does the patient have breastfeeding experience prior to this delivery?: No  Feeding Mother's Current Feeding Choice: Breast Milk  LATCH Score Latch: Too sleepy or reluctant, no latch achieved, no sucking elicited.  Audible Swallowing: None  Type of Nipple: Everted at rest and after stimulation  Comfort (Breast/Nipple): Soft / non-tender  Hold (Positioning): Assistance needed to correctly position infant at breast and maintain latch.  LATCH Score: 5  Interventions Interventions: Breast feeding basics reviewed;Assisted with latch;Skin to skin;Hand express;Education;LC Services brochure;CDC milk storage guidelines  Discharge Pump: Personal;DEBP  Consult Status Consult Status: Follow-up Date: 09/08/24 Follow-up type: In-patient   Shannon Levorn Lemme  RN, IBCLC 09/07/2024, 12:40 PM

## 2024-09-07 NOTE — Op Note (Signed)
 Cynthia Reeves PROCEDURE DATE: 09/07/2024  PREOPERATIVE DIAGNOSIS: Intrauterine pregnancy at  [redacted]w[redacted]d weeks gestation; malpresentation: breech  POSTOPERATIVE DIAGNOSIS: The same  PROCEDURE: Primary Low Transverse Cesarean Section  SURGEON:  Dr. Lang Peel  ASSISTANT: Dr Barkley Angles    INDICATIONS: Cynthia Reeves is a 28 y.o. G2P1011 at [redacted]w[redacted]d scheduled for cesarean section secondary to malpresentation: breech.  The risks of cesarean section discussed with the patient included but were not limited to: bleeding which may require transfusion or reoperation; infection which may require antibiotics; injury to bowel, bladder, ureters or other surrounding organs; injury to the fetus; need for additional procedures including hysterectomy in the event of a life-threatening hemorrhage; placental abnormalities wth subsequent pregnancies, incisional problems, thromboembolic phenomenon and other postoperative/anesthesia complications. The patient concurred with the proposed plan, giving informed written consent for the procedure.    An experienced assistant was required given the standard of surgical care given the complexity of the case.  This assistant was needed for exposure, dissection, suctioning, retraction, instrument exchange, assisting with delivery with administration of fundal pressure, and for overall help during the procedure.  FINDINGS:  Viable female infant in breech presentation.  Apgars 9 and 9, weight, 7 pounds and 9.7 ounces.  Clear amniotic fluid.  Intact placenta, three vessel cord.  Normal uterus, fallopian tubes and ovaries bilaterally.  ANESTHESIA:    Spinal INTRAVENOUS FLUIDS:800 ml ESTIMATED BLOOD LOSS: 602 ml URINE OUTPUT:  125 ml SPECIMENS: Placenta sent to L&D COMPLICATIONS: None immediate  PROCEDURE IN DETAIL:  The patient received intravenous antibiotics and had sequential compression devices applied to her lower extremities while in the  preoperative area.  She was then taken to the operating room where spinal anesthesia was administered  and was found to be adequate. She was then placed in a dorsal supine position with a leftward tilt, and prepped and draped in a sterile manner.  A foley catheter was placed into her bladder and attached to constant gravity, which drained clear fluid throughout.  After an adequate timeout was performed, a Pfannenstiel skin incision was made with scalpel and carried through to the underlying layer of fascia. The fascia was incised in the midline and this incision was extended bilaterally using the Mayo scissors. Kocher clamps were applied to the superior aspect of the fascial incision and the underlying rectus muscles were dissected off bluntly. A similar process was carried out on the inferior aspect of the facial incision. The rectus muscles were separated in the midline bluntly and the peritoneum was entered bluntly. An Alexis retractor was placed to aid in visualization of the uterus.    Attention was turned to the lower uterine segment where a transverse hysterotomy was made with a scalpel and extended bilaterally bluntly. The infant was successfully delivered, and cord was clamped and cut and infant was handed over to awaiting neonatology team. Uterine massage was then administered and the placenta delivered intact with three-vessel cord. The uterus was then cleared of clot and debris.  The hysterotomy was closed with 0 Monocryl in a running fashion. Overall, excellent hemostasis was noted. The abdomen and the pelvis were cleared of all clot and debris and the Thersia was removed. Hemostasis was confirmed on all surfaces.  The peritoneum was reapproximated using 2-0 vicryl running stitches. The fascia was then closed using 0 Vicryl in a running fashion. The subcutaneous layer was reapproximated with plain gut and the skin was closed with 4-0 vicryl. The patient tolerated the procedure well. Sponge, lap,  instrument and needle counts were correct x 2. She was taken to the recovery room in stable condition.    [name], DO 09/07/2024 11:15 AM

## 2024-09-07 NOTE — Transfer of Care (Signed)
 Immediate Anesthesia Transfer of Care Note  Patient: Cynthia Reeves  Procedure(s) Performed: CESAREAN DELIVERY  Patient Location: PACU  Anesthesia Type:Spinal  Level of Consciousness: awake, alert , and oriented  Airway & Oxygen Therapy: Patient Spontanous Breathing  Post-op Assessment: Report given to RN, Post -op Vital signs reviewed and stable, and Post -op Vital signs reviewed and unstable, Anesthesiologist notified  Post vital signs: Reviewed and stable  Last Vitals:  Vitals Value Taken Time  BP 93/55 09/07/24 09:37  Temp    Pulse 76 09/07/24 09:38  Resp 21 09/07/24 09:38  SpO2 97 % 09/07/24 09:38  Vitals shown include unfiled device data.  Last Pain:  Vitals:   09/07/24 0635  TempSrc:   PainSc: 0-No pain         Complications: No notable events documented.

## 2024-09-07 NOTE — Anesthesia Procedure Notes (Signed)
 Spinal  Patient location during procedure: OR Start time: 09/07/2024 8:19 AM End time: 09/07/2024 8:22 AM Reason for block: surgical anesthesia  Staffing Performed: anesthesiologist  Authorized by: Lucious Debby BRAVO, MD   Performed by: Lucious Debby BRAVO, MD  Preanesthetic Checklist Completed: patient identified, IV checked, risks and benefits discussed, surgical consent, monitors and equipment checked, pre-op evaluation and timeout performed Spinal Block Patient position: sitting Prep: DuraPrep Patient monitoring: heart rate, cardiac monitor, continuous pulse ox and blood pressure Approach: midline Location: L3-4 Injection technique: single-shot Needle Needle type: Pencan  Needle gauge: 24 G  Additional Notes Consent was obtained prior to the procedure with all questions answered and concerns addressed. Risks including, but not limited to, bleeding, infection, nerve damage, paralysis, failed block, inadequate analgesia, allergic reaction, high spinal, itching, and headache were discussed and the patient wished to proceed. Functioning IV was confirmed and monitors were applied. Sterile prep and drape, including hand hygiene, mask, and sterile gloves were used. The patient was positioned and the spine was prepped. The skin was anesthetized with lidocaine. Free flow of clear CSF was obtained prior to injecting local anesthetic into the CSF. The spinal needle aspirated freely following injection. The needle was carefully withdrawn. The patient tolerated the procedure well.   Debby Lucious, MD

## 2024-09-07 NOTE — H&P (Signed)
 Faculty Practice H&P  Cynthia Reeves is a 28 y.o. female G2P0010 with IUP at [redacted]w[redacted]d presenting for primary cesarean section for breech. Pregnancy was been complicated by mal position, marginal cord insertion.    Pt states she has been having no contractions, no vaginal bleeding, intact membranes, with normal fetal movement.     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  She plans to breastfeed  Prenatal labs and studies: ABO, Rh: --/--/B POS (12/22 0645) Antibody: NEG (12/22 0645) Rubella: Immune (06/05 0000) RPR: NON REACTIVE (12/19 1156)  HBsAg: Negative (06/05 0000)  HIV: Non-reactive (06/05 0000)  GBS:    2hr Glucola: negative Genetic screening: normal Anatomy US : normal  Past Medical History:  Past Medical History:  Diagnosis Date   Medical history non-contributory     Past Surgical History:  Past Surgical History:  Procedure Laterality Date   NO PAST SURGERIES      Obstetrical History:  OB History     Gravida  2   Para      Term      Preterm      AB  1   Living         SAB  1   IAB      Ectopic      Multiple      Live Births              Gynecological History:  OB History     Gravida  2   Para      Term      Preterm      AB  1   Living         SAB  1   IAB      Ectopic      Multiple      Live Births              Social History:  Social History   Socioeconomic History   Marital status: Single    Spouse name: Not on file   Number of children: Not on file   Years of education: Not on file   Highest education level: Not on file  Occupational History   Not on file  Tobacco Use   Smoking status: Never    Smokeless tobacco: Never  Vaping Use   Vaping status: Some Days   Substances: Flavoring  Substance and Sexual Activity   Alcohol use: Yes    Comment: occ   Drug use: Never   Sexual activity: Yes    Birth control/protection: None  Other Topics Concern   Not on file  Social History Narrative   Not on file   Social Drivers of Health   Tobacco Use: Low Risk (09/06/2024)   Patient History    Smoking Tobacco Use: Never    Smokeless Tobacco Use: Never    Passive Exposure: Not on file  Financial Resource Strain: Not on file  Food Insecurity: No Food Insecurity (09/07/2024)   Epic    Worried About Programme Researcher, Broadcasting/film/video in the Last Year: Never true    Ran Out of Food in the Last Year: Never true  Transportation Needs: No Transportation Needs (09/07/2024)   Epic  Lack of Transportation (Medical): No    Lack of Transportation (Non-Medical): No  Physical Activity: Not on file  Stress: Not on file  Social Connections: Not on file  Depression (EYV7-0): Not on file  Alcohol Screen: Not on file  Housing: Unknown (09/07/2024)   Epic    Unable to Pay for Housing in the Last Year: No    Number of Times Moved in the Last Year: Not on file    Homeless in the Last Year: No  Utilities: Not At Risk (09/07/2024)   Epic    Threatened with loss of utilities: No  Health Literacy: Not on file    Family History: History reviewed. No pertinent family history.  Medications:  Prenatal vitamins,  Current Facility-Administered Medications  Medication Dose Route Frequency Provider Last Rate Last Admin   ceFAZolin  (ANCEF ) IVPB 2g/100 mL premix  2 g Intravenous On Call to OR Wouk, Devaughn Sayres, MD       lactated ringers  infusion   Intravenous Continuous Jerrye Sharper, MD       sterile water  for irrigation    PRN Dequante Tremaine J, DO   1,000 mL at 09/07/24 9251    Allergies: Allergies[1]  Review of Systems: - negative  Physical Exam: Blood pressure 121/70, pulse 97, temperature 98.3 F (36.8  C), temperature source Oral, resp. rate 15, height 5' 5 (1.651 m), weight 112.2 kg, last menstrual period 12/08/2023. GENERAL: Well-developed, well-nourished female in no acute distress.  LUNGS: Clear to auscultation bilaterally.  HEART: Regular rate and rhythm. ABDOMEN: Soft, nontender, nondistended, gravid.  EXTREMITIES: Nontender, no edema, 2+ distal pulses.    Pertinent Labs/Studies:   Lab Results  Component Value Date   WBC 7.8 09/04/2024   HGB 11.8 (L) 09/04/2024   HCT 35.1 (L) 09/04/2024   MCV 92.6 09/04/2024   PLT 242 09/04/2024    Assessment : Cynthia Reeves is a 28 y.o. G2P0010 at [redacted]w[redacted]d being admitted for cesarean section secondary to breech  Plan: The risks of cesarean section discussed with the patient included but were not limited to: bleeding which may require transfusion or reoperation; infection which may require antibiotics; injury to bowel, bladder, ureters or other surrounding organs; injury to the fetus; need for additional procedures including hysterectomy in the event of a life-threatening hemorrhage; placental abnormalities wth subsequent pregnancies, incisional problems, thromboembolic phenomenon and other postoperative/anesthesia complications. The patient concurred with the proposed plan, giving informed written consent for the procedure.   Patient has been NPO since last night and will remain NPO for procedure.  Preoperative prophylactic Ancef  ordered on call to the OR.    Legend Tumminello J, DO 09/07/2024, 7:59 AM        [1]  Allergies Allergen Reactions   Amoxicillin      BV

## 2024-09-07 NOTE — Progress Notes (Signed)
 Orthostatic VS delayed due to emesis. Zofran  was given per order.

## 2024-09-07 NOTE — Lactation Note (Addendum)
 This note was copied from a baby's chart. Lactation Consultation Note  Patient Name: Cynthia Reeves Unijb'd Date: 09/07/2024 Age:26 hours Reason for consult: Follow-up assessment;1st time breastfeeding  P1, Request to assist with breastfeeding.   Mother really wants to breastfeed her baby. Mother hand expressed ample flow of colostrum. Assisted with latching in cross cradle hold with breast compression to keep baby on deep. Mother needed hands on assistance since she was post C-section and alone in the room.  Baby opened wide and latched with good depth after a few attempts. Baby sustained latch for 20 min with intermittent swallows.  Repositioned to football hold mid feeding. Toward the end of the feeding, mother became nauseous. Vomited 900 cc of emesis.   Swaddled baby and contacted RN.   NT came in room to check on mother.   Lactation to follow up with mother tonight if she needs assistance.  Maternal Data Has patient been taught Hand Expression?: Yes Does the patient have breastfeeding experience prior to this delivery?: No  Feeding Mother's Current Feeding Choice: Breast Milk  LATCH Score Latch: Repeated attempts needed to sustain latch, nipple held in mouth throughout feeding, stimulation needed to elicit sucking reflex.  Audible Swallowing: A few with stimulation  Type of Nipple: Everted at rest and after stimulation  Comfort (Breast/Nipple): Soft / non-tender  Hold (Positioning): Assistance needed to correctly position infant at breast and maintain latch.  LATCH Score: 7  Interventions Interventions: Breast feeding basics reviewed;Assisted with latch;Skin to skin;Hand express;Education  Discharge Pump: Personal;DEBP  Consult Status Consult Status: Follow-up Date: 09/08/24 Follow-up type: In-patient   Shannon Levorn Lemme  RN, IBCLC 09/07/2024, 2:46 PM

## 2024-09-07 NOTE — Anesthesia Postprocedure Evaluation (Signed)
"   Anesthesia Post Note  Patient: Cynthia Reeves  Procedure(s) Performed: CESAREAN DELIVERY     Patient location during evaluation: PACU Anesthesia Type: Spinal Level of consciousness: awake and alert Pain management: pain level controlled Vital Signs Assessment: post-procedure vital signs reviewed and stable Respiratory status: spontaneous breathing and respiratory function stable Cardiovascular status: blood pressure returned to baseline and stable Postop Assessment: spinal receding and no apparent nausea or vomiting Anesthetic complications: no   No notable events documented.  Last Vitals:  Vitals:   09/07/24 1102 09/07/24 1157  BP: (!) 96/53 117/61  Pulse: 74 73  Resp: 18 18  Temp: 36.4 C 36.6 C  SpO2: 100% 100%    Last Pain:  Vitals:   09/07/24 1209  TempSrc:   PainSc: 2    Pain Goal: Patients Stated Pain Goal: 3 (09/07/24 1159)                 Debby FORBES Like      "

## 2024-09-08 LAB — CBC
HCT: 28.2 % — ABNORMAL LOW (ref 36.0–46.0)
Hemoglobin: 9.5 g/dL — ABNORMAL LOW (ref 12.0–15.0)
MCH: 30.7 pg (ref 26.0–34.0)
MCHC: 33.7 g/dL (ref 30.0–36.0)
MCV: 91.3 fL (ref 80.0–100.0)
Platelets: 257 K/uL (ref 150–400)
RBC: 3.09 MIL/uL — ABNORMAL LOW (ref 3.87–5.11)
RDW: 13.2 % (ref 11.5–15.5)
WBC: 9.7 K/uL (ref 4.0–10.5)
nRBC: 0 % (ref 0.0–0.2)

## 2024-09-08 MED ORDER — OXYCODONE HCL 5 MG PO TABS
5.0000 mg | ORAL_TABLET | ORAL | Status: DC | PRN
  Administered 2024-09-08 – 2024-09-09 (×4): 5 mg via ORAL
  Filled 2024-09-08 (×6): qty 1

## 2024-09-08 MED ORDER — FERROUS SULFATE 325 (65 FE) MG PO TABS
325.0000 mg | ORAL_TABLET | ORAL | Status: DC
  Administered 2024-09-08: 325 mg via ORAL
  Filled 2024-09-08: qty 1

## 2024-09-08 MED ORDER — ACETAMINOPHEN 325 MG PO TABS
650.0000 mg | ORAL_TABLET | ORAL | Status: DC | PRN
  Administered 2024-09-08 – 2024-09-09 (×4): 650 mg via ORAL
  Filled 2024-09-08 (×4): qty 2

## 2024-09-08 MED ORDER — OXYCODONE HCL 5 MG PO TABS
10.0000 mg | ORAL_TABLET | ORAL | Status: DC | PRN
  Administered 2024-09-08: 10 mg via ORAL

## 2024-09-08 NOTE — Progress Notes (Signed)
 POSTPARTUM PROGRESS NOTE  POD #1  Subjective:  Cynthia Reeves is a 28 y.o. G2P1011 s/p pLTCS at [redacted]w[redacted]d.  She reports she doing well. No acute events overnight. She reports she is doing well. She denies any problems with ambulating, voiding or po intake. Denies nausea or vomiting. She has passed flatus. Pain is well controlled.  Lochia is appropriate.  Objective: Blood pressure (!) 105/53, pulse 67, temperature 98.1 F (36.7 C), temperature source Oral, resp. rate 17, height 5' 5 (1.651 m), weight 112.2 kg, last menstrual period 12/08/2023, SpO2 100%, unknown if currently breastfeeding.  Physical Exam:  General: alert, cooperative and no distress Chest: no respiratory distress Heart:regular rate, distal pulses intact Abdomen: soft, nontender,  Uterine Fundus: firm, appropriately tender DVT Evaluation: No calf swelling or tenderness Extremities: Trace edema Skin: warm, dry; incision clean/dry/intact w/ honeycomb dressing in place  Recent Labs    09/08/24 0445  HGB 9.5*  HCT 28.2*    Assessment/Plan: Cynthia Reeves is a 28 y.o. G2P1011 s/p primary low-transverse C-section at [redacted]w[redacted]d for breech presentation.  POD#1 - Doing welll; pain well controlled. H/H appropriate  Routine postpartum care  OOB, ambulated  Lovenox for VTE prophylaxis Acute blood loss anemia: asymptomatic  Start po ferrous sulfate  BID  Contraception: Undecided Feeding: Breast  Dispo: Plan for discharge over the next day.   LOS: 1 day   Steffan Rover, MD Attending Family Medicine Physician, [facility] for [facility], [facility]   09/08/2024, 4:30 PM

## 2024-09-08 NOTE — Patient Instructions (Signed)
 If you are interested in an outpatient lactation consultation -- available in-office or virtually -- please reach out to us  at:  [facility] (First Floor) ?? [address], [place], [place]  ?? [phone] Please leave a message on our lactation voicemail box. We welcome any lactation-related questions or concerns -- our team is here to support you and your baby.  Lactation Support Groups Join us  at: [facility] ?? Tuesdays, 10:00 AM - 12:00 PM ?? [address], [employer], [employer]  Lactating parents and lap babies are welcome!  ?? [email or web address]  ?? [email or web address]  Si est interesado en una consulta ambulatoria de lactancia, disponible en el consultorio o virtualmente, comunquese con nosotros en:  [facility] ([facility] ?? [address], [place], [place]  ?? [phone] Por favor, deje un mensaje en nuestro buzn de voz de lactancia. Estamos aqu para responder cualquier pregunta o inquietud relacionada con la lactancia y para apoyarle a usted y a su beb.  Grupos de Apoyo para la Lactancia nase a nosotros en: [facility] ?? [place], de 10:00 a. m. a 12:00 p. m. ?? [address], [place], [place]  Se admiten madres lactantes y bebs en regazo.  ?? [email or web address]  ?? [email or web address]      Cynthia Reeves, [facility] for [facility]

## 2024-09-08 NOTE — Lactation Note (Signed)
 This note was copied from a baby's chart. Lactation Consultation Note  Patient Name: Cynthia Reeves Date: 09/08/2024 Age:26 hours Reason for consult: Follow-up assessment;Primapara;1st time breastfeeding;Term;Breastfeeding assistance  P1- RN requested latching assistance for infant. When LC entered the room, MOB had recently pumped about 2 mL of EBM. LC demonstrated how to spoon feed EBM to infant. Infant was still cueing, so LC placed infant on the right breast in the football hold. LC reviewed how to compress the breast and stroke her nipple from infant's nose to chin to elicit a gaping mouth. Infant was eager and latched with first attempt. Infant had flanged lips and a strong rhythmic pull. Infant was still nursing when [facility] left the room. LC encouraged MOB to call for further assistance as needed.  Maternal Data Has patient been taught Hand Expression?: Yes Does the patient have breastfeeding experience prior to this delivery?: No  Feeding Mother's Current Feeding Choice: Breast Milk  LATCH Score Latch: Grasps breast easily, tongue down, lips flanged, rhythmical sucking.  Audible Swallowing: Spontaneous and intermittent  Type of Nipple: Everted at rest and after stimulation  Comfort (Breast/Nipple): Soft / non-tender  Hold (Positioning): Assistance needed to correctly position infant at breast and maintain latch.  LATCH Score: 9   Lactation Tools Discussed/Used Tools: Pump;Flanges Breast pump type: Manual Pump Education: Setup, frequency, and cleaning;Milk Storage Reason for Pumping: MOB request Pumping frequency: 15-20 min every 3 hrs  Interventions Interventions: Breast feeding basics reviewed;Assisted with latch;Breast compression;Adjust position;Support pillows;Position options;Expressed milk;Hand pump;Education;LC Services brochure  Discharge Discharge Education: Engorgement and breast care;Warning signs for feeding baby Pump:  DEBP;Personal  Consult Status Consult Status: Follow-up Date: 09/08/24 Follow-up type: In-patient    Recardo Hoit BS, IBCLC 09/08/2024, 1:42 AM

## 2024-09-08 NOTE — Lactation Note (Signed)
 This note was copied from a baby's chart. Lactation Consultation Note  Patient Name: Cynthia Reeves Unijb'd Date: 09/08/2024 Age:26 hours  Reason for consult: Primapara;1st time breastfeeding;Term  P1, [redacted]w[redacted]d, 6% weight loss  Follow up visit with mother and baby. Mother gave expressed milk by spoon and formula fed baby with this the hour. Mother will call for next feeding for assistance with breastfeeding.      Consult Status Consult Status: Follow-up Date: 09/09/24    [name] 09/08/2024, 1:41 PM

## 2024-09-08 NOTE — Lactation Note (Signed)
 This note was copied from a baby's chart. Lactation Consultation Note  Patient Name: Cynthia Reeves Date: 09/08/2024 Age:26 hours  Reason for consult: Follow-up assessment;Term  P1, [redacted]w[redacted]d, 6% weight loss  Returned to room and mother requesting assistance with latching baby to breast. Mother sitting recliner and prefers to do latch in football. Mother and family made aware that supervision is needed so baby does not fall.   Mother reports she just spoon fed baby 10 ml of colostrum. Mother can hand express with ease. Basic breastfeeding education with baby in football hold. Mother needed assistance with holding breast to align with infant's mouth and helping guide baby on with her chin into the breast. Baby is eager to latch. Baby tends to suck on her upper lip and tongue thrust after a few strong suckles. Suck training exercises with baby sucking gloved finger. Advised mother to wait for wide mouth and hold her breast in baby's mouth until she can sustain suck. Lots of colostrum observed and expressed during the feeding.   Encouraged mother to continue to latch baby, getting her deep on to the breast and helping her sustain latch. Encouraged mother to call for assistance as needed.    Maternal Data    Feeding Mother's Current Feeding Choice: Breast Milk and Formula  LATCH Score Latch: Repeated attempts needed to sustain latch, nipple held in mouth throughout feeding, stimulation needed to elicit sucking reflex.  Audible Swallowing: A few with stimulation  Type of Nipple: Everted at rest and after stimulation  Comfort (Breast/Nipple): Soft / non-tender  Hold (Positioning): Assistance needed to correctly position infant at breast and maintain latch.  LATCH Score: 7   Lactation Tools Discussed/Used Pumped volume: 10 mL  Interventions Interventions: Breast feeding basics reviewed;Assisted with latch;Skin to skin;Hand express;Breast compression;Adjust  position;Support pillows;Education  Discharge    Consult Status Consult Status: Follow-up Date: 09/09/24 Follow-up type: In-patient    Joshua Line M 09/08/2024, 4:28 PM

## 2024-09-09 ENCOUNTER — Other Ambulatory Visit (HOSPITAL_COMMUNITY): Payer: Self-pay

## 2024-09-09 MED ORDER — DOCUSATE SODIUM 100 MG PO CAPS
100.0000 mg | ORAL_CAPSULE | Freq: Two times a day (BID) | ORAL | 0 refills | Status: AC
  Filled 2024-09-09: qty 20, 10d supply, fill #0

## 2024-09-09 MED ORDER — IBUPROFEN 600 MG PO TABS
600.0000 mg | ORAL_TABLET | Freq: Four times a day (QID) | ORAL | 0 refills | Status: AC
  Filled 2024-09-09: qty 30, 8d supply, fill #0

## 2024-09-09 MED ORDER — FERROUS SULFATE 325 (65 FE) MG PO TABS
325.0000 mg | ORAL_TABLET | ORAL | 0 refills | Status: AC
  Filled 2024-09-09: qty 30, 60d supply, fill #0

## 2024-09-09 MED ORDER — ACETAMINOPHEN 325 MG PO TABS
650.0000 mg | ORAL_TABLET | Freq: Four times a day (QID) | ORAL | 0 refills | Status: AC | PRN
  Filled 2024-09-09: qty 30, 4d supply, fill #0

## 2024-09-09 MED ORDER — OXYCODONE HCL 5 MG PO TABS
5.0000 mg | ORAL_TABLET | Freq: Four times a day (QID) | ORAL | 0 refills | Status: DC | PRN
  Filled 2024-09-09: qty 28, 4d supply, fill #0

## 2024-09-09 MED ORDER — FAMOTIDINE 20 MG PO TABS
20.0000 mg | ORAL_TABLET | Freq: Once | ORAL | 0 refills | Status: DC
  Filled 2024-09-09: qty 30, 30d supply, fill #0

## 2024-09-09 NOTE — Discharge Summary (Signed)
 "    Postpartum Discharge Summary  Date of Service updated-12/24     Patient Name: Cynthia Reeves DOB: 1996-01-07 MRN: [id number]  Date of admission: 09/07/2024 Delivery date:09/07/2024 Delivering provider: BARBRA LANG PARAS Date of discharge: 09/09/2024  Admitting diagnosis: [redacted] weeks gestation of pregnancy [Z3A.39] Pregnancy [Z34.90] Intrauterine pregnancy: [redacted]w[redacted]d     Secondary diagnosis:  Principal Problem:   Pregnancy  Additional problems: Fetal malprsentation     Discharge diagnosis: Term Pregnancy Delivered and Anemia                                              Post partum procedures:none Augmentation: N/A Complications: None  Hospital course: Sceduled C/S   28 y.o. yo G2P1011 at [redacted]w[redacted]d was admitted to the hospital 09/07/2024 for scheduled cesarean section with the following indication:Malpresentation.Delivery details are as follows:  Membrane Rupture Time/Date: 8:44 AM,09/07/2024  Delivery Method:C-Section, Low Transverse Operative Delivery:N/A Details of operation can be found in separate operative note.  Patient had a postpartum course was uncomplicated.  She is ambulating, tolerating a regular diet, passing flatus, and urinating well. Patient is discharged home in stable condition on  09/09/2024        Newborn Data: Birth date:09/07/2024 Birth time:8:46 AM Gender:Female Living status:Living Apgars:9 ,9  Weight:3450 g    Magnesium Sulfate received: No BMZ received: No Rhophylac:No MMR:No T-DaP:offered postpartum Flu: No RSV Vaccine received: No Transfusion:No  Immunizations received: There is no immunization history for the selected administration types on file for this patient.  Physical exam  Vitals:   09/08/24 1432 09/08/24 1949 09/08/24 2100 09/09/24 0525  BP: (!) 105/53 (!) 114/56 (!) 100/52 112/65  Pulse: 67 71 68 69  Resp: 17 16 16 18   Temp: 98.1 F (36.7 C) 97.9 F (36.6 C) 98.4 F (36.9 C) 98.1 F (36.7 C)  TempSrc: Oral Oral  Oral Oral  SpO2: 100% 99%  99%  Weight:      Height:       General: alert, cooperative, and no distress Lochia: appropriate Uterine Fundus: firm Incision: Dressing is clean, dry, and intact DVT Evaluation: minimal edema Labs: Lab Results  Component Value Date   WBC 9.7 09/08/2024   HGB 9.5 (L) 09/08/2024   HCT 28.2 (L) 09/08/2024   MCV 91.3 09/08/2024   PLT 257 09/08/2024       No data to display         Edinburgh Score:     No data to display         No data recorded  After visit meds:  Allergies as of 09/09/2024       Reactions   Amoxicillin     BV        Medication List     STOP taking these medications    ferrous sulfate  325 (65 FE) MG EC tablet Replaced by: ferrous sulfate  325 (65 FE) MG tablet       TAKE these medications    acetaminophen  325 MG tablet Commonly known as: TYLENOL  Take 2 tablets (650 mg total) by mouth every 6 (six) hours as needed for mild pain (pain score 1-3). What changed:  medication strength how much to take when to take this reasons to take this   docusate sodium  100 MG capsule Commonly known as: Colace Take 1 capsule (100 mg total) by mouth 2 (two) times daily for 10  days.   famotidine  20 MG tablet Commonly known as: PEPCID  Take 1 tablet (20 mg total) by mouth once.   ferrous sulfate  325 (65 FE) MG tablet Take 1 tablet (325 mg total) by mouth every other day. Start taking on: September 10, 2024 Replaces: ferrous sulfate  325 (65 FE) MG EC tablet   ibuprofen  600 MG tablet Commonly known as: ADVIL  Take 1 tablet (600 mg total) by mouth every 6 (six) hours.   oxyCODONE  5 MG immediate release tablet Commonly known as: Oxy IR/ROXICODONE  Take 1-2 tablets (5-10 mg total) by mouth every 6 (six) hours as needed for severe pain (pain score 7-10) or breakthrough pain.   prenatal multivitamin Tabs tablet Take 1 tablet by mouth daily.         Discharge home in stable condition Infant Feeding: Breast Infant  Disposition:home with mother Discharge instruction: per After Visit Summary and Postpartum booklet. Activity: Advance as tolerated. Pelvic rest for 6 weeks.  Diet: routine diet Future Appointments:No future appointments. Follow up Visit:  Follow-up Information     [facility]. Go in 1 week(s).   Specialty: Obstetrics and Gynecology Why: Follow up in 1wk for an incision check Contact information: [address] [place]  [address] [phone]                 Please schedule this patient for a In person postpartum visit in 1 week with the following provider: Any provider. Additional Postpartum F/U:Incision check 1 week  Low risk pregnancy complicated by: breech presentation Delivery mode:  C-Section, Low Transverse Anticipated Birth Control:  not sure considering POPs   09/09/2024 Anysia Choi M Nika Yazzie, DO    "

## 2024-09-09 NOTE — Progress Notes (Signed)
 POSTPARTUM PROGRESS NOTE  POD #2  Subjective:  Cynthia Reeves is a 28 y.o. G2P1011 s/p pLTCS at [redacted]w[redacted]d. Today she notes she is doing well. She denies any problems with ambulating, voiding or po intake. Denies nausea or vomiting. She has passed flatus, Pain is well controlled- some discomfort with ambulation, but improving.  Lochia appropriate Denies fever/chills/chest pain/SOB.  no HA, no blurry vision, no RUQ pain  Objective: Blood pressure 112/65, pulse 69, temperature 98.1 F (36.7 C), temperature source Oral, resp. rate 18, height 5' 5 (1.651 m), weight 112.2 kg, last menstrual period 12/08/2023, SpO2 99%, unknown if currently breastfeeding.  Physical Exam:  General: alert, cooperative and no distress Chest: no respiratory distress Heart: regular rate and rhythm Abdomen: soft, nontender, +BS Uterine Fundus: firm, appropriately tender Incision: C/D/I with honeycomb DVT Evaluation: No calf swelling or tenderness Extremities: 1+ edema Skin: warm, dry  No results found for this or any previous visit (from the past 24 hours).  Assessment/Plan: Cynthia Reeves is a 27 y.o. G2P1011 s/p pLTCS at [redacted]w[redacted]d POD#2  -pain well controlled -meeting milestones appropriately  Contraception: undecided, may consider POPs, but will discuss postpartum Feeding: breastfeeding  Dispo: Doing well and possible discharge home pending baby status.   LOS: 2 days   Braxton Vantrease, DO Faculty Attending, [facility] 09/09/2024, 8:08 AM

## 2024-09-13 ENCOUNTER — Ambulatory Visit

## 2024-09-14 ENCOUNTER — Telehealth (HOSPITAL_COMMUNITY): Payer: Self-pay

## 2024-09-14 NOTE — Telephone Encounter (Signed)
 09/14/2024 1957  Name: Cynthia Reeves MRN: [id number] DOB: Oct 17, 1995  Reason for Call:  Transition of Care Hospital Discharge Call  Contact Status: Patient Contact Status: Complete  Language assistant needed:          Follow-Up Questions: Do You Have Any Concerns About Your Health As You Heal From Delivery?: Yes What Concerns Do You Have About Your Health?: Patient states that she has removed her honeycomb dressing from her cesarean incision but states that she has skin glue in place. Patient asks if she is to remove skin glue from incision. RN told patient to leave the skin glue in place. RN reviewed incision care and signs of infection to report to the provider. Patient states that she has some bruising to the skin above and below her incision. She states that the bruising is improving. RN told patient that bruising may be from manipulation of tissue during surgery. Patient is aware of her incision check appointment on 09/21/24. Patient has no other concerns or questions. Do You Have Any Concerns About Your Infants Health?: No  Edinburgh Postnatal Depression Scale:  In the Past 7 Days: I have been able to laugh and see the funny side of things.: As much as I always could I have looked forward with enjoyment to things.: As much as I ever did I have blamed myself unnecessarily when things went wrong.: Yes, most of the time I have been anxious or worried for no good reason.: No, not at all I have felt scared or panicky for no good reason.: No, not at all Things have been getting on top of me.: No, I have been coping as well as ever I have been so unhappy that I have had difficulty sleeping.: Not at all I have felt sad or miserable.: No, not at all I have been so unhappy that I have been crying.: No, never The thought of harming myself has occurred to me.: Never Edinburgh Postnatal Depression Scale Total: 3  PHQ2-9 Depression Scale:     Discharge Follow-up: Edinburgh  score requires follow up?: No Patient was advised of the following resources:: Breastfeeding Support Group, Support Group  Post-discharge interventions: Reviewed Newborn Safe Sleep Practices  Signature  Rosaline Deretha PEAK

## 2024-09-21 ENCOUNTER — Ambulatory Visit: Payer: Self-pay

## 2024-09-21 VITALS — BP 101/76 | HR 66 | Ht 65.0 in | Wt 238.9 lb

## 2024-09-21 DIAGNOSIS — Z98891 History of uterine scar from previous surgery: Secondary | ICD-10-CM

## 2024-09-21 NOTE — Progress Notes (Addendum)
 Cynthia Reeves is here today for an incision check. She delivered 09/07/24 primary C-section (2wks postpartum). The incision appears well approximated, clean, no redness, no swelling or pain. Reviewed daily wound care; patient verbalizes understanding. Reviewed signs/symptoms of infection; patient verbalized understanding. Reviewed MAU precautions; patient verbalized understanding. Patient states no further questions or concerns.   Devon, RN 09/21/24

## 2024-10-08 ENCOUNTER — Ambulatory Visit: Payer: Self-pay | Admitting: *Deleted

## 2024-10-08 ENCOUNTER — Other Ambulatory Visit (HOSPITAL_COMMUNITY)
Admission: RE | Admit: 2024-10-08 | Discharge: 2024-10-08 | Disposition: A | Source: Ambulatory Visit | Attending: Obstetrics & Gynecology | Admitting: Obstetrics & Gynecology

## 2024-10-08 VITALS — BP 112/72 | HR 68 | Ht 66.0 in | Wt 239.7 lb

## 2024-10-08 DIAGNOSIS — N898 Other specified noninflammatory disorders of vagina: Secondary | ICD-10-CM | POA: Insufficient documentation

## 2024-10-08 NOTE — Progress Notes (Signed)
 Here for possible UTI. C/o she was having some burning with urination last week. Today collected clean catch UA which was negative. Also c/o having dark vaginal spotting , sometimes has clots about once aday about quarter to golf ball size . C/o odor at incision and at vaginal area. UA negative. Explained will do self swab and she will be notified if positive and needs treatment. Incision CDI , no discharge. Slight pink noted. Reviewed wound care and stressed keeping incision clean and dry.  Discussed with Dr. Nicholaus and advised may put think coat of bacitracin on incision daily. Reviewed postpartum appointment 10/21/24. Reviewed normal bleeding for postpartum and when to contact provider or go to hospital. She voices understanding.  Rock Skip PEAK

## 2024-10-09 ENCOUNTER — Ambulatory Visit: Payer: Self-pay | Admitting: Obstetrics and Gynecology

## 2024-10-09 LAB — CERVICOVAGINAL ANCILLARY ONLY
Bacterial Vaginitis (gardnerella): NEGATIVE
Candida Glabrata: NEGATIVE
Candida Vaginitis: NEGATIVE
Chlamydia: NEGATIVE
Comment: NEGATIVE
Comment: NEGATIVE
Comment: NEGATIVE
Comment: NEGATIVE
Comment: NEGATIVE
Comment: NORMAL
Neisseria Gonorrhea: NEGATIVE
Trichomonas: NEGATIVE

## 2024-10-09 LAB — POCT URINALYSIS DIP (DEVICE)
Bilirubin Urine: NEGATIVE
Glucose, UA: NEGATIVE mg/dL
Hgb urine dipstick: NEGATIVE
Ketones, ur: NEGATIVE mg/dL
Leukocytes,Ua: NEGATIVE
Nitrite: NEGATIVE
Protein, ur: NEGATIVE mg/dL
Specific Gravity, Urine: 1.02 (ref 1.005–1.030)
Urobilinogen, UA: 0.2 mg/dL (ref 0.0–1.0)
pH: 7 (ref 5.0–8.0)

## 2024-10-21 ENCOUNTER — Ambulatory Visit: Payer: Self-pay | Admitting: Family Medicine

## 2024-10-22 ENCOUNTER — Encounter (HOSPITAL_COMMUNITY): Payer: Self-pay | Admitting: Obstetrics & Gynecology

## 2024-10-22 ENCOUNTER — Ambulatory Visit: Payer: Self-pay | Admitting: Student

## 2024-10-22 ENCOUNTER — Inpatient Hospital Stay (HOSPITAL_COMMUNITY)
Admission: AD | Admit: 2024-10-22 | Discharge: 2024-10-22 | Disposition: A | Discharge status: Home / Self Care | Source: Home / Self Care | Attending: Obstetrics & Gynecology | Admitting: Obstetrics & Gynecology

## 2024-10-22 DIAGNOSIS — F53 Postpartum depression: Secondary | ICD-10-CM

## 2024-10-22 LAB — CBC
HCT: 36.8 % (ref 36.0–46.0)
Hemoglobin: 12.4 g/dL (ref 12.0–15.0)
MCH: 30.8 pg (ref 26.0–34.0)
MCHC: 33.7 g/dL (ref 30.0–36.0)
MCV: 91.5 fL (ref 80.0–100.0)
Platelets: 303 10*3/uL (ref 150–400)
RBC: 4.02 MIL/uL (ref 3.87–5.11)
RDW: 12.3 % (ref 11.5–15.5)
WBC: 6.3 10*3/uL (ref 4.0–10.5)
nRBC: 0 % (ref 0.0–0.2)

## 2024-10-22 LAB — POCT PREGNANCY, URINE: Preg Test, Ur: NEGATIVE

## 2024-10-22 NOTE — MAU Provider Note (Signed)
 " History     CSN: [id number]  Arrival date and time: 10/22/24 1555   Event Date/Time   First Provider Initiated Contact with Patient 10/22/24 1823      Chief Complaint  Patient presents with   Vaginal Bleeding   HPI Cynthia Reeves is a 29 y.o. G2P1011 female at 6 weeks post partum who presents for vaginal bleeding.  Bleeding started 2 days ago. Yesterday was having to change pad every hour. Has decreased today. Reports menstrual like cramps. Denies fever. Has not been sexually active since delivery.  Was dizzy earlier today & reports syncopal episode around 1 pm. At the time she had not eaten all day nor had any water . She is breastfeeding.  Denies CP, SOB, or palpitations.   Missed her postpartum visit yesterday. Would like to reschedule to discuss depressive symptoms. States she has been anxious & depressed. Has been crying more & has had no appetite. Denies SI/HI. Has tried meditating and talking to her family without improvement in symptoms.   OB History     Gravida  2   Para  1   Term  1   Preterm      AB  1   Living  1      SAB  1   IAB      Ectopic      Multiple  0   Live Births  1           Past Medical History:  Diagnosis Date   Medical history non-contributory     Past Surgical History:  Procedure Laterality Date   CESAREAN SECTION N/A 09/07/2024   Procedure: CESAREAN DELIVERY;  Surgeon: Barbra Lang PARAS, DO;  Location: [facility] LD ORS;  Service: Obstetrics;  Laterality: N/A;    No family history on file.  Social History[1]  Allergies: Allergies[2]  No medications prior to admission.    Review of Systems  All other systems reviewed and are negative.  Physical Exam   Blood pressure 119/64, pulse 80, temperature 97.8 F (36.6 C), resp. rate 18, height 5' 6 (1.676 m), weight 108 kg, currently breastfeeding.  Physical Exam Vitals and nursing note reviewed.  Constitutional:      General: She is not in acute distress.     Appearance: She is well-developed. She is not ill-appearing.  HENT:     Head: Normocephalic and atraumatic.  Eyes:     General: No scleral icterus.       Right eye: No discharge.        Left eye: No discharge.     Conjunctiva/sclera: Conjunctivae normal.  Pulmonary:     Effort: Pulmonary effort is normal. No respiratory distress.  Neurological:     General: No focal deficit present.     Mental Status: She is alert.  Psychiatric:        Mood and Affect: Mood normal.        Behavior: Behavior normal.     MAU Course  Procedures Results for orders placed or performed during the hospital encounter of 10/22/24 (from the past 24 hours)  Pregnancy, urine POC     Status: None   Collection Time: 10/22/24  5:27 PM  Result Value Ref Range   Preg Test, Ur NEGATIVE NEGATIVE  CBC     Status: None   Collection Time: 10/22/24  6:09 PM  Result Value Ref Range   WBC 6.3 4.0 - 10.5 K/uL   RBC 4.02 3.87 - 5.11 MIL/uL  Hemoglobin 12.4 12.0 - 15.0 g/dL   HCT 63.1 63.9 - 53.9 %   MCV 91.5 80.0 - 100.0 fL   MCH 30.8 26.0 - 34.0 pg   MCHC 33.7 30.0 - 36.0 g/dL   RDW 87.6 88.4 - 84.4 %   Platelets 303 150 - 400 K/uL   nRBC 0.0 0.0 - 0.2 %   No results found.  MDM   Assessment and Plan   1. Postpartum bleeding -Likely onset of menses. Hemoglobin & vital signs stable.  -Suspect her syncopal episode due to lack of intake. Discussed increasing intake earlier in the day, especially given that she is breast feeding. If has repeat episode should be seen by ED or PCP.    2. Postpartum depression  -Discussed medication & counseling with patient. Patient would like to hold off on medications at this time but would like referral to [facility]. Referral placed. Also sent message to reschedule her postpartum visit     Rocky Satterfield 10/22/2024, 10:39 PM      [1]  Social History Tobacco Use   Smoking status: Never   Smokeless tobacco: Never  Vaping Use   Vaping status: Some Days   Substances:  Flavoring  Substance Use Topics   Alcohol use: Yes    Comment: occ   Drug use: Never  [2]  Allergies Allergen Reactions   Amoxicillin      BV   "

## 2024-10-22 NOTE — MAU Note (Signed)
 Cynthia Reeves is a 29 y.o. at Unknown here in MAU reporting: heavy vaginal bleeding that stared yesterday. Changing pad every hour.  Passed out around 1 pm today.  PP c-section 09/07/2025 LMP:  Onset of complaint: yesterday Pain score: 8  Vitals:   10/22/24 1706  BP: 119/64  Pulse: 80  Resp: 18  Temp: 97.8 F (36.6 C)     FHT: n/a  Lab orders placed from triage: upt

## 2024-11-04 ENCOUNTER — Ambulatory Visit
